# Patient Record
Sex: Male | Born: 1970
Health system: Southern US, Community
[De-identification: ages and names within clinical notes are randomized; demographics above are authoritative.]

## PROBLEM LIST (undated history)

## (undated) DIAGNOSIS — S060X9A Concussion with loss of consciousness of unspecified duration, initial encounter: Secondary | ICD-10-CM

## (undated) DIAGNOSIS — S060XAA Concussion with loss of consciousness status unknown, initial encounter: Secondary | ICD-10-CM

## (undated) HISTORY — PX: WISDOM TOOTH EXTRACTION: SHX21

## (undated) HISTORY — PX: ADENOIDECTOMY: SUR15

## (undated) HISTORY — DX: Concussion with loss of consciousness of unspecified duration, initial encounter: S06.0X9A

## (undated) HISTORY — DX: Concussion with loss of consciousness status unknown, initial encounter: S06.0XAA

---

## 2015-01-09 ENCOUNTER — Other Ambulatory Visit (HOSPITAL_COMMUNITY): Payer: Self-pay | Admitting: Pulmonary Disease

## 2015-01-09 DIAGNOSIS — J690 Pneumonitis due to inhalation of food and vomit: Secondary | ICD-10-CM

## 2015-01-14 ENCOUNTER — Ambulatory Visit (HOSPITAL_COMMUNITY)
Admission: RE | Admit: 2015-01-14 | Discharge: 2015-01-14 | Disposition: A | Discharge status: Home / Self Care | Payer: 59 | Source: Ambulatory Visit | Attending: Pulmonary Disease | Admitting: Pulmonary Disease

## 2015-01-14 DIAGNOSIS — J690 Pneumonitis due to inhalation of food and vomit: Secondary | ICD-10-CM | POA: Diagnosis not present

## 2015-01-14 MED ORDER — IOHEXOL 300 MG/ML  SOLN
75.0000 mL | Freq: Once | INTRAMUSCULAR | Status: AC | PRN
  Administered 2015-01-14: 75 mL via INTRAVENOUS

## 2016-12-02 DIAGNOSIS — J069 Acute upper respiratory infection, unspecified: Secondary | ICD-10-CM | POA: Diagnosis not present

## 2016-12-02 DIAGNOSIS — J329 Chronic sinusitis, unspecified: Secondary | ICD-10-CM | POA: Diagnosis not present

## 2019-02-09 ENCOUNTER — Other Ambulatory Visit: Payer: Self-pay

## 2019-02-09 DIAGNOSIS — Z20822 Contact with and (suspected) exposure to covid-19: Secondary | ICD-10-CM

## 2019-02-10 LAB — NOVEL CORONAVIRUS, NAA: SARS-CoV-2, NAA: NOT DETECTED

## 2019-06-28 ENCOUNTER — Ambulatory Visit: Payer: 59 | Attending: Internal Medicine

## 2019-06-28 ENCOUNTER — Other Ambulatory Visit: Payer: Self-pay

## 2019-06-28 DIAGNOSIS — Z20822 Contact with and (suspected) exposure to covid-19: Secondary | ICD-10-CM

## 2019-06-29 LAB — SARS-COV-2, NAA 2 DAY TAT

## 2019-06-29 LAB — NOVEL CORONAVIRUS, NAA: SARS-CoV-2, NAA: NOT DETECTED

## 2019-08-08 ENCOUNTER — Other Ambulatory Visit: Payer: Self-pay

## 2019-08-08 ENCOUNTER — Emergency Department (HOSPITAL_COMMUNITY): Admission: EM | Admit: 2019-08-08 | Discharge: 2019-08-08 | Discharge status: Against Medical Advice | Payer: 59

## 2019-08-08 NOTE — ED Notes (Signed)
Patient called with no answer. °

## 2019-08-09 ENCOUNTER — Ambulatory Visit: Admission: EM | Admit: 2019-08-09 | Discharge: 2019-08-09 | Payer: 59 | Source: Home / Self Care

## 2019-08-09 ENCOUNTER — Emergency Department (HOSPITAL_COMMUNITY)
Admission: EM | Admit: 2019-08-09 | Discharge: 2019-08-09 | Disposition: A | Discharge status: Home / Self Care | Payer: 59 | Attending: Emergency Medicine | Admitting: Emergency Medicine

## 2019-08-09 ENCOUNTER — Emergency Department (HOSPITAL_COMMUNITY): Payer: 59

## 2019-08-09 ENCOUNTER — Other Ambulatory Visit: Payer: Self-pay

## 2019-08-09 ENCOUNTER — Encounter (HOSPITAL_COMMUNITY): Payer: Self-pay

## 2019-08-09 DIAGNOSIS — S00531A Contusion of lip, initial encounter: Secondary | ICD-10-CM | POA: Diagnosis not present

## 2019-08-09 DIAGNOSIS — Y999 Unspecified external cause status: Secondary | ICD-10-CM | POA: Insufficient documentation

## 2019-08-09 DIAGNOSIS — W228XXA Striking against or struck by other objects, initial encounter: Secondary | ICD-10-CM | POA: Diagnosis not present

## 2019-08-09 DIAGNOSIS — Y9289 Other specified places as the place of occurrence of the external cause: Secondary | ICD-10-CM | POA: Diagnosis not present

## 2019-08-09 DIAGNOSIS — Z880 Allergy status to penicillin: Secondary | ICD-10-CM | POA: Diagnosis not present

## 2019-08-09 DIAGNOSIS — S060X9A Concussion with loss of consciousness of unspecified duration, initial encounter: Secondary | ICD-10-CM | POA: Insufficient documentation

## 2019-08-09 DIAGNOSIS — S0990XA Unspecified injury of head, initial encounter: Secondary | ICD-10-CM | POA: Diagnosis present

## 2019-08-09 DIAGNOSIS — Y9363 Activity, rugby: Secondary | ICD-10-CM | POA: Diagnosis not present

## 2019-08-09 DIAGNOSIS — S0083XA Contusion of other part of head, initial encounter: Secondary | ICD-10-CM | POA: Diagnosis not present

## 2019-08-09 DIAGNOSIS — S0081XA Abrasion of other part of head, initial encounter: Secondary | ICD-10-CM

## 2019-08-09 MED ORDER — ONDANSETRON 4 MG PO TBDP: 4.0000 mg | ORAL_TABLET | Freq: Three times a day (TID) | ORAL | 0 refills | Status: DC | PRN

## 2019-08-09 MED ORDER — MELOXICAM 7.5 MG PO TABS: 7.5000 mg | ORAL_TABLET | Freq: Two times a day (BID) | ORAL | 0 refills | Status: AC | PRN

## 2019-08-09 NOTE — ED Triage Notes (Signed)
Pt reports was using some post hole diggers Monday and they broke and hit him in left side of face.  Reports fell and blacked out.  Pt says since then has had nausea, vomiting, and feeling like he can't think clearly.  Reports history of concussion and says feels very similar.

## 2019-08-09 NOTE — ED Provider Notes (Signed)
[facility] EMERGENCY DEPARTMENT Provider Note   CSN: [id number] Arrival date & time: 08/09/19  5093     History Chief Complaint  Patient presents with  . Head Injury    Travis Farmer is a 49 y.o. male.  HPI   This patient is a 49 year old male, has a history of prior minor head injury when he was younger playing rugby, denies any other significant chronic medical conditions.  Reportedly 2 days ago while the patient was putting in a sprinkler system and using a post hole digger he was accidentally struck in the face on the left side of his cheek with a post hole digger which then caused him to potentially pass out in fact he has very little memory of 24 hours after that event.  His sister who is a secondary historian was called from the patient's best friend who asked her to come and check on him because he was not his normal self.  There was some significant memory loss and they were concerned that with his headache there may be more going on.  She reports that when she came to her parents house last night to check on him he was sleeping, when she woke up this morning he seemed to be awake alert and able to follow commands with normal speech and coordination.  There was definitely a dense memory loss with evidence of left-sided facial injury but no signs of focal neurologic deficits.  He does have a headache, it is poorly described.  He is able to recall other events outside of the 24 hours following the injury, and answer questions appropriately  History reviewed. No pertinent past medical history.  There are no problems to display for this patient.   History reviewed. No pertinent surgical history.     No family history on file.  Social History   Tobacco Use  . Smoking status: Never Smoker  . Smokeless tobacco: Never Used  Substance Use Topics  . Alcohol use: Never  . Drug use: Never    Home Medications Prior to Admission medications   Medication Sig Start Date End  Date Taking? Authorizing Provider  meloxicam (MOBIC) 7.5 MG tablet Take 1 tablet (7.5 mg total) by mouth 2 (two) times daily as needed for up to 14 days for pain. 08/09/19 08/23/19  Noemi Chapel, MD  ondansetron (ZOFRAN ODT) 4 MG disintegrating tablet Take 1 tablet (4 mg total) by mouth every 8 (eight) hours as needed for nausea. 08/09/19   Noemi Chapel, MD    Allergies    Penicillins  Review of Systems   Review of Systems  All other systems reviewed and are negative.   Physical Exam Updated Vital Signs BP (!) 142/92 (BP Location: Right Arm)   Pulse (!) 106   Temp 97.6 F (36.4 C) (Oral)   Resp 16   Ht 1.803 m (5\' 11" )   Wt 77.1 kg   SpO2 96%   BMI 23.71 kg/m   Physical Exam Vitals and nursing note reviewed.  Constitutional:      General: He is not in acute distress.    Appearance: He is well-developed.  HENT:     Head: Normocephalic.     Comments: Slight abrasion and bruising to the left side of the cheek and upper lip, dentition is normal    Nose:     Comments: No tenderness over the nasal bridge, no nasal septal hematoma or epistaxis    Mouth/Throat:     Mouth: Mucous membranes are  dry.     Pharynx: No oropharyngeal exudate.  Eyes:     General: No scleral icterus.       Right eye: No discharge.        Left eye: No discharge.     Conjunctiva/sclera: Conjunctivae normal.     Pupils: Pupils are equal, round, and reactive to light.  Neck:     Thyroid: No thyromegaly.     Vascular: No JVD.  Cardiovascular:     Rate and Rhythm: Normal rate and regular rhythm.     Heart sounds: Normal heart sounds. No murmur. No friction rub. No gallop.   Pulmonary:     Effort: Pulmonary effort is normal. No respiratory distress.     Breath sounds: Normal breath sounds. No wheezing or rales.  Abdominal:     General: Bowel sounds are normal. There is no distension.     Palpations: Abdomen is soft. There is no mass.     Tenderness: There is no abdominal tenderness.  Musculoskeletal:         General: No tenderness. Normal range of motion.     Cervical back: Normal range of motion and neck supple.  Lymphadenopathy:     Cervical: No cervical adenopathy.  Skin:    General: Skin is warm and dry.     Findings: No erythema or rash.  Neurological:     Mental Status: He is alert.     Coordination: Coordination normal.     Comments: Speech is clear, cranial nerves III through XII are intact, memory is intact, strength is normal in all 4 extremities including grips, sensation is intact to light touch and pinprick in all 4 extremities. Coordination as tested by finger-nose-finger is normal, no limb ataxia. Normal gait, normal reflexes at the patellar tendons bilaterally  Psychiatric:        Behavior: Behavior normal.     ED Results / Procedures / Treatments   Labs (all labs ordered are listed, but only abnormal results are displayed) Labs Reviewed - No data to display  EKG None  Radiology No results found.  Procedures Procedures (including critical care time)  Medications Ordered in ED Medications - No data to display  ED Course  I have reviewed the triage vital signs and the nursing notes.  Pertinent labs & imaging results that were available during my care of the patient were reviewed by me and considered in my medical decision making (see chart for details).    MDM Rules/Calculators/A&P                      This patient is well-appearing at this time however he has had significant memory loss ongoing headache and evidence of head injury after having concussions when he was younger.  We will perform CT scan of the brain to rule out intracranial hemorrhage or other injury.  At this time the patient will need to be evaluated with a CT scan, the patient and the sister are at the bedside and expressed their understanding and agreement to the plan.  CT scan negative, patient updated, stable for discharge, anti-inflammatories at home  Final Clinical Impression(s) /  ED Diagnoses Final diagnoses:  Concussion with loss of consciousness, initial encounter  Abrasion, face w/o infection    Rx / DC Orders ED Discharge Orders         Ordered    meloxicam (MOBIC) 7.5 MG tablet  2 times daily PRN     08/09/19 0919  ondansetron (ZOFRAN ODT) 4 MG disintegrating tablet  Every 8 hours PRN     08/09/19 0920           Noemi Chapel, MD 08/09/19 [phone]

## 2019-08-09 NOTE — ED Notes (Signed)
Patient is being discharged from the Urgent Care and sent to the Emergency Department via family POV . Per NP, patient is in need of higher level of care due to head injury. Patient is aware and verbalizes understanding of plan of care.

## 2019-08-09 NOTE — Discharge Instructions (Signed)
Your CT scan is normal Read attached instructions  Mobic for pain Zofran for nausea  ER for worsening symptoms  See your doctor in the next week for a recheck

## 2019-10-03 ENCOUNTER — Encounter (INDEPENDENT_AMBULATORY_CARE_PROVIDER_SITE_OTHER): Payer: Self-pay | Admitting: *Deleted

## 2020-02-15 ENCOUNTER — Ambulatory Visit: Payer: 59 | Attending: Internal Medicine

## 2020-02-15 DIAGNOSIS — Z23 Encounter for immunization: Secondary | ICD-10-CM

## 2020-02-15 NOTE — Progress Notes (Signed)
   Covid-19 Vaccination Clinic  Name:  Travis Farmer    MRN: [id number] DOB: [birth year]  02/15/2020  [name] was observed post Covid-19 immunization for 15 minutes without incident. He was provided with Vaccine Information Sheet and instruction to access the V-Safe system.   [name] was instructed to call 911 with any severe reactions post vaccine: Marland Kitchen Difficulty breathing  . Swelling of face and throat  . A fast heartbeat  . A bad rash all over body  . Dizziness and weakness   Immunizations Administered    Name Date Dose VIS Date Route   Pfizer COVID-19 Vaccine 02/15/2020  2:27 PM 0.3 mL 12/20/2019 Intramuscular   Manufacturer: [employer]   Lot: [id number]   NDC: [address]-2

## 2020-03-11 ENCOUNTER — Other Ambulatory Visit: Payer: Self-pay

## 2020-03-11 ENCOUNTER — Encounter: Payer: Self-pay | Admitting: Neurology

## 2020-03-11 ENCOUNTER — Ambulatory Visit: Payer: 59 | Admitting: Neurology

## 2020-03-11 VITALS — BP 122/82 | HR 79 | Ht 70.0 in | Wt 165.2 lb

## 2020-03-11 DIAGNOSIS — F0781 Postconcussional syndrome: Secondary | ICD-10-CM

## 2020-03-11 DIAGNOSIS — R4020 Unspecified coma: Secondary | ICD-10-CM | POA: Diagnosis not present

## 2020-03-11 MED ORDER — DIVALPROEX SODIUM ER 500 MG PO TB24: 500.0000 mg | ORAL_TABLET | Freq: Every day | ORAL | 2 refills | Status: DC

## 2020-03-11 NOTE — Progress Notes (Signed)
[facility] [address] [place]. [place] [address] [phone]       OFFICE CONSULT NOTE  [name] Date of Birth:  03/27/1970 Medical Record Number:  [id number]   Referring MD: Delphina Cahill  Reason for Referral: Postconcussion symptoms  HPI: Travis Farmer is a pleasant 50 year old Caucasian male seen today for initial office consultation visit.  History is obtained from the patient and his wife as well as review of referral notes.  No relevant imaging studies are available for review.  Patient states that he has had some multifocal symptoms going of episode of passing out and facial trauma on 08/09/2019.  He states he was alone at home and was probably working out in the garden.  He woke up at home disoriented and confused at trouble texting his family members called him on the phone he was not acting right.  He had sustained bruising on his face.  There is no tongue bite or other injury noted.  Patient denies any headache or soreness at that time.  He was seen at [facility] emergency room on 08/09/2019 where he actually stated to Dr. Noemi Chapel, ER, MD that he was putting a sprinkler system in his garden and using a post whole digger when he accidentally stuck it on his face on the left side of his cheek which potentially caused him to pass out.  He had very little memory for 24 hours after that event and was at home alone.  Patient did describe a headache after that.  CT scan of the head on 08/09/2019 personally reviewed by me was normal.  Patient states since then he has had trouble sleeping trouble focusing and difficulty working at his job.  He is feels he cannot concentrate for long and has easy distractibility and trouble with multitasking.  He also gets headaches off and on as well as neck pain.  He feels dizzy at times even emotionally labile and starts crying.  He denies feeling depressed.  He does have history of several minor head injuries when he played  rugby in college in high school.  He also had 1 concussion which he got from [place].  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  He has no prior history of seizures, stroke, TIA, migraines or significant neurological problems.  ROS:   14 system review of systems is positive for headache, memory loss, decreased sleeping, weakness, decreased concentration all other symptoms negative  PMH:  Past Medical History:  Diagnosis Date  . Concussion     Social History:  Social History   Socioeconomic History  . Marital status: Married    Spouse name: Travis Farmer  . Number of children: Not on file  . Years of education: Not on file  . Highest education level: Not on file  Occupational History  . Occupation: full time  Tobacco Use  . Smoking status: Never Smoker  . Smokeless tobacco: Never Used  Substance and Sexual Activity  . Alcohol use: Never  . Drug use: Never  . Sexual activity: Not on file  Other Topics Concern  . Not on file  Social History Narrative   Lives with wife and 2 daughter   Right Handed   Drinks 2-3 cups caffeine daily   Social Determinants of Health   Financial Resource Strain: Not on file  Food Insecurity: Not on file  Transportation Needs: Not on file  Physical Activity: Not on file  Stress: Not on file  Social Connections: Not on file  Intimate Partner Violence: Not on file    Medications:   Current Outpatient Medications on File Prior to Visit  Medication Sig Dispense Refill  . ondansetron (ZOFRAN ODT) 4 MG disintegrating tablet Take 1 tablet (4 mg total) by mouth every 8 (eight) hours as needed for nausea. 10 tablet 0   No current facility-administered medications on file prior to visit.    Allergies:   Allergies  Allergen Reactions  . Penicillins     Physical Exam General: well developed, well nourished middle-aged Caucasian male, seated, in no evident distress Head: head  normocephalic and atraumatic.   Neck: supple with no carotid or supraclavicular bruits Cardiovascular: regular rate and rhythm, no murmurs Musculoskeletal: no deformity Skin:  no rash/petichiae Vascular:  Normal pulses all extremities  Neurologic Exam Mental Status: Awake and fully alert. Oriented to place and time. Recent and remote memory intact. Attention span, concentration and fund of knowledge appropriate. Mood and affect appropriate.  Mini-Mental status exam score 28/30 with 1 deficit in recall and attention calculation.  Clock drawing 4/4.  Able to name 14 animals which can walk on forelegs.  Geriatric depression scale score 2 not suggestive of depression Cranial Nerves: Fundoscopic exam reveals sharp disc margins. Pupils equal, briskly reactive to light. Extraocular movements full without nystagmus. Visual fields full to confrontation. Hearing intact. Facial sensation intact. Face, tongue, palate moves normally and symmetrically.  Motor: Normal bulk and tone. Normal strength in all tested extremity muscles. Sensory.: intact to touch , pinprick , position and vibratory sensation.  Coordination: Rapid alternating movements normal in all extremities. Finger-to-nose and heel-to-shin performed accurately bilaterally. Gait and Station: Arises from chair without difficulty. Stance is normal. Gait demonstrates normal stride length and balance . Able to heel, toe and tandem walk without difficulty.  Reflexes: 1+ and symmetric. Toes downgoing.       ASSESSMENT: 50 year old patient with episode of brief loss of consciousness with facial trauma having symptoms consistent with postconcussion syndrome following episode in June 2021.  He also has underlying stress and anxiety which could be contributing.     PLAN: I had a long discussion with the patient and his wife regarding his episode of brief loss of consciousness and followed by memory loss and multifocal symptoms being of unclear etiology  possibly unwitnessed seizure versus postconcussion symptoms.  Recommend further evaluation with checking MRI scan of the brain and EEG.  Trial of Depakote ER 500 mg daily for his headaches and multifocal symptoms.  I also encouraged him to increase participation in regular activities for stress relaxation like meditation yoga and exercises.  He will return for follow-up in the future in 2 months or call earlier if necessary.  Greater than 50% time during this 45-minute consultation visit was spent on counseling and coordination of care about his multifocal symptoms and answering questions. Antony Contras, MD Note: This document was prepared with digital dictation and possible smart phrase technology. Any transcriptional errors that result from this process are unintentional.

## 2020-03-11 NOTE — Patient Instructions (Signed)
I had a long discussion with the patient and his wife regarding his episode of brief loss of consciousness and followed by memory loss and multifocal symptoms being of unclear etiology possibly unwitnessed seizure versus postconcussion symptoms.  Recommend further evaluation with checking MRI scan of the brain and EEG.  Trial of Depakote ER 500 mg daily for his headaches and multifocal symptoms.  I also encouraged him to increase participation in regular activities for stress relaxation like meditation yoga and exercises.  He will return for follow-up in the future in 2 months or call earlier if necessary.  Mindfulness-Based Stress Reduction Mindfulness-based stress reduction (MBSR) is a program that helps people learn to practice mindfulness. Mindfulness is the practice of intentionally paying attention to the present moment. MBSR focuses on developing self-awareness, which allows you to respond to life stress without judgment or negative emotions. It can be learned and practiced through techniques such as education, breathing exercises, meditation, and yoga. MBSR includes several mindfulness techniques in one program. MBSR works best when you understand the treatment, are willing to try new things, and can commit to spending time practicing what you learn. MBSR training may include learning about:  How your emotions, thoughts, and reactions affect your body.  New ways to respond to things that cause negative thoughts to start (triggers).  How to notice your thoughts and let go of them.  Practicing awareness of everyday things that you normally do without thinking.  The techniques and goals of different types of meditation. What are the benefits of MBSR? MBSR can have many benefits, which include helping you to:  Develop self-awareness. This refers to knowing and understanding yourself.  Learn skills and attitudes that help you to participate in your own health care.  Learn new ways to care  for yourself.  Be more accepting about how things are, and let things go.  Be less judgmental and approach things with an open mind.  Be patient with yourself and trust yourself more. MBSR has also been shown to:  Reduce negative emotions, such as depression and anxiety.  Improve memory and focus.  Change how you sense and approach pain.  Boost your body's ability to fight infections.  Help you connect better with other people.  Improve your sense of well-being. Follow these instructions at home:  Find a local in-person or online MBSR program.  Set aside some time regularly for mindfulness practice.  Find a mindfulness practice that works best for you. This may include one or more of the following: ? Meditation. Meditation involves focusing your mind on a certain thought or activity. ? Breathing awareness exercises. These help you to stay present by focusing on your breath. ? Body scan. For this practice, you lie down and pay attention to each part of your body from head to toe. You can identify tension and soreness and intentionally relax parts of your body. ? Yoga. Yoga involves stretching and breathing, and it can improve your ability to move and be flexible. It can also provide an experience of testing your body's limits, which can help you release stress. ? Mindful eating. This way of eating involves focusing on the taste, texture, color, and smell of each bite of food. Because this slows down eating and helps you feel full sooner, it can be an important part of a weight-loss plan.  Find a podcast or recording that provides guidance for breathing awareness, body scan, or meditation exercises. You can listen to these any time when you have  a free moment to rest without distractions.  Follow your treatment plan as told by your health care provider. This may include taking regular medicines and making changes to your diet or lifestyle as recommended.   How to practice  mindfulness To do a basic awareness exercise:  Find a comfortable place to sit.  Pay attention to the present moment. Observe your thoughts, feelings, and surroundings just as they are.  Avoid placing judgment on yourself, your feelings, or your surroundings. Make note of any judgment that comes up, and let it go.  Your mind may wander, and that is okay. Make note of when your thoughts drift, and return your attention to the present moment. To do basic mindfulness meditation:  Find a comfortable place to sit. This may include a stable chair or a firm floor cushion. ? Sit upright with your back straight. Let your arms fall next to your side with your hands resting on your legs. ? If sitting in a chair, rest your feet flat on the floor. ? If sitting on a cushion, cross your legs in front of you.  Keep your head in a neutral position with your chin dropped slightly. Relax your jaw and rest the tip of your tongue on the roof of your mouth. Drop your gaze to the floor. You can close your eyes if you like.  Breathe normally and pay attention to your breath. Feel the air moving in and out of your nose. Feel your belly expanding and relaxing with each breath.  Your mind may wander, and that is okay. Make note of when your thoughts drift, and return your attention to your breath.  Avoid placing judgment on yourself, your feelings, or your surroundings. Make note of any judgment or feelings that come up, let them go, and bring your attention back to your breath.  When you are ready, lift your gaze or open your eyes. Pay attention to how your body feels after the meditation. Where to find more information You can find more information about MBSR from:  Your health care provider.  Community-based meditation centers or programs.  Programs offered near you. Summary  Mindfulness-based stress reduction (MBSR) is a program that teaches you how to intentionally pay attention to the present moment.  It is used with other treatments to help you cope better with daily stress, emotions, and pain.  MBSR focuses on developing self-awareness, which allows you to respond to life stress without judgment or negative emotions.  MBSR programs may involve learning different mindfulness practices, such as breathing exercises, meditation, yoga, body scan, or mindful eating. Find a mindfulness practice that works best for you, and set aside time for it on a regular basis. This information is not intended to replace advice given to you by your health care provider. Make sure you discuss any questions you have with your health care provider. Document Revised: 11/03/2019 Document Reviewed: 11/03/2019 Elsevier Patient Education  2021 [employer].

## 2020-03-13 ENCOUNTER — Telehealth: Payer: Self-pay | Admitting: Neurology

## 2020-03-13 NOTE — Telephone Encounter (Signed)
[employer] pending uplaoded notes on the portal  

## 2020-03-18 ENCOUNTER — Other Ambulatory Visit: Payer: 59

## 2020-03-20 NOTE — Telephone Encounter (Signed)
Russell:  [id number] (exp. 03/14/20 to 04/28/20) patient is scheduled at [facility] for 03/27/20.

## 2020-03-26 ENCOUNTER — Ambulatory Visit: Payer: 59 | Admitting: Neurology

## 2020-03-26 DIAGNOSIS — F0781 Postconcussional syndrome: Secondary | ICD-10-CM

## 2020-03-26 DIAGNOSIS — R4020 Unspecified coma: Secondary | ICD-10-CM | POA: Diagnosis not present

## 2020-03-27 ENCOUNTER — Ambulatory Visit: Payer: 59

## 2020-03-27 ENCOUNTER — Other Ambulatory Visit: Payer: Self-pay

## 2020-03-27 ENCOUNTER — Telehealth: Payer: Self-pay | Admitting: *Deleted

## 2020-03-27 DIAGNOSIS — R4020 Unspecified coma: Secondary | ICD-10-CM | POA: Diagnosis not present

## 2020-03-27 MED ORDER — GADOBENATE DIMEGLUMINE 529 MG/ML IV SOLN
15.0000 mL | Freq: Once | INTRAVENOUS | Status: AC | PRN
  Administered 2020-03-27: 15 mL via INTRAVENOUS

## 2020-03-27 MED ORDER — GADOBENATE DIMEGLUMINE 529 MG/ML IV SOLN: 15.0000 mL | Freq: Once | INTRAVENOUS | Status: DC | PRN

## 2020-03-27 NOTE — Telephone Encounter (Signed)
I spoke to the patient and informed him of the results.

## 2020-03-27 NOTE — Progress Notes (Signed)
Kindly inform the patient that EEG was normal

## 2020-03-27 NOTE — Telephone Encounter (Signed)
-----   Message from Garvin Fila, MD sent at 03/27/2020  9:26 AM EST ----- Kindly inform the patient that EEG was normal

## 2020-04-03 NOTE — Progress Notes (Signed)
Kindly inform the patient her MRI scan of the brain was normal and did not show any worrisome findings

## 2020-04-04 ENCOUNTER — Telehealth: Payer: Self-pay | Admitting: *Deleted

## 2020-04-04 NOTE — Telephone Encounter (Signed)
LVM for pt about MRI results per Dr. Leonie Man note. Advised he didn't have to return call unless he had further questions.

## 2020-04-04 NOTE — Telephone Encounter (Signed)
-----   Message from Garvin Fila, MD sent at 04/03/2020  3:49 PM EST ----- Kindly inform the patient her MRI scan of the brain was normal and did not show any worrisome findings

## 2020-04-12 ENCOUNTER — Other Ambulatory Visit (INDEPENDENT_AMBULATORY_CARE_PROVIDER_SITE_OTHER): Payer: Self-pay

## 2020-04-12 ENCOUNTER — Telehealth (INDEPENDENT_AMBULATORY_CARE_PROVIDER_SITE_OTHER): Payer: Self-pay

## 2020-04-12 ENCOUNTER — Encounter (INDEPENDENT_AMBULATORY_CARE_PROVIDER_SITE_OTHER): Payer: Self-pay | Admitting: *Deleted

## 2020-04-12 ENCOUNTER — Encounter (INDEPENDENT_AMBULATORY_CARE_PROVIDER_SITE_OTHER): Payer: Self-pay

## 2020-04-12 DIAGNOSIS — Z1211 Encounter for screening for malignant neoplasm of colon: Secondary | ICD-10-CM

## 2020-04-12 MED ORDER — PEG 3350-KCL-NA BICARB-NACL 420 G PO SOLR: 4000.0000 mL | ORAL | 0 refills | Status: DC

## 2020-04-12 NOTE — Telephone Encounter (Signed)
Referring MD/PCP: [place] Crane   Procedure: Tcs  Reason/Indication:  Screening  Has patient had this procedure before?  no  If so, when, by whom and where?    Is there a family history of colon cancer?  no  Who?  What age when diagnosed?    Is patient diabetic?   no      Does patient have prosthetic heart valve or mechanical valve?  no  Do you have a pacemaker/defibrillator?  no  Has patient ever had endocarditis/atrial fibrillation? no  Have you had a stroke/heart attack last 6 mths? no  Does patient use oxygen? no  Has patient had joint replacement within last 12 months?  no  Is patient constipated or do they take laxatives? no  Does patient have a history of alcohol/drug use?  no  Is patient on blood thinner such as Coumadin, Plavix and/or Aspirin? no  Medications: none  Allergies: pcn  Procedure date & time: 05/01/20

## 2020-04-12 NOTE — Telephone Encounter (Signed)
Travis Farmer, CMA  

## 2020-04-12 NOTE — Telephone Encounter (Signed)
Noted  

## 2020-04-25 ENCOUNTER — Encounter (HOSPITAL_COMMUNITY): Payer: Self-pay | Admitting: Gastroenterology

## 2020-04-26 ENCOUNTER — Other Ambulatory Visit (INDEPENDENT_AMBULATORY_CARE_PROVIDER_SITE_OTHER): Payer: Self-pay

## 2020-04-29 ENCOUNTER — Other Ambulatory Visit (HOSPITAL_COMMUNITY): Payer: 59

## 2020-04-29 ENCOUNTER — Other Ambulatory Visit (HOSPITAL_COMMUNITY)
Admission: RE | Admit: 2020-04-29 | Discharge: 2020-04-29 | Disposition: A | Discharge status: Home / Self Care | Payer: 59 | Source: Ambulatory Visit | Attending: Gastroenterology | Admitting: Gastroenterology

## 2020-04-29 ENCOUNTER — Other Ambulatory Visit: Payer: Self-pay

## 2020-04-29 DIAGNOSIS — Z01812 Encounter for preprocedural laboratory examination: Secondary | ICD-10-CM | POA: Insufficient documentation

## 2020-04-29 DIAGNOSIS — Z20822 Contact with and (suspected) exposure to covid-19: Secondary | ICD-10-CM | POA: Insufficient documentation

## 2020-04-29 LAB — SARS CORONAVIRUS 2 (TAT 6-24 HRS): SARS Coronavirus 2: NEGATIVE

## 2020-05-01 ENCOUNTER — Other Ambulatory Visit: Payer: Self-pay

## 2020-05-01 ENCOUNTER — Ambulatory Visit (HOSPITAL_COMMUNITY)
Admission: RE | Admit: 2020-05-01 | Discharge: 2020-05-01 | Disposition: A | Discharge status: Home / Self Care | Payer: 59 | Attending: Gastroenterology | Admitting: Gastroenterology

## 2020-05-01 ENCOUNTER — Ambulatory Visit (HOSPITAL_COMMUNITY): Admission: RE | Admit: 2020-05-01 | Payer: 59 | Source: Home / Self Care | Admitting: Gastroenterology

## 2020-05-01 ENCOUNTER — Ambulatory Visit (HOSPITAL_COMMUNITY): Payer: 59 | Admitting: Anesthesiology

## 2020-05-01 ENCOUNTER — Encounter (HOSPITAL_COMMUNITY): Admission: RE | Payer: Self-pay | Source: Home / Self Care

## 2020-05-01 ENCOUNTER — Encounter (HOSPITAL_COMMUNITY)
Admission: RE | Disposition: A | Discharge status: Home / Self Care | Payer: Self-pay | Source: Home / Self Care | Attending: Gastroenterology

## 2020-05-01 ENCOUNTER — Encounter (HOSPITAL_COMMUNITY): Payer: Self-pay | Admitting: Gastroenterology

## 2020-05-01 DIAGNOSIS — C7A8 Other malignant neuroendocrine tumors: Secondary | ICD-10-CM | POA: Diagnosis not present

## 2020-05-01 DIAGNOSIS — Z800 Family history of malignant neoplasm of digestive organs: Secondary | ICD-10-CM | POA: Insufficient documentation

## 2020-05-01 DIAGNOSIS — Z79899 Other long term (current) drug therapy: Secondary | ICD-10-CM | POA: Diagnosis not present

## 2020-05-01 DIAGNOSIS — D128 Benign neoplasm of rectum: Secondary | ICD-10-CM | POA: Diagnosis not present

## 2020-05-01 DIAGNOSIS — Z1211 Encounter for screening for malignant neoplasm of colon: Secondary | ICD-10-CM | POA: Insufficient documentation

## 2020-05-01 DIAGNOSIS — K621 Rectal polyp: Secondary | ICD-10-CM | POA: Diagnosis not present

## 2020-05-01 DIAGNOSIS — K648 Other hemorrhoids: Secondary | ICD-10-CM

## 2020-05-01 HISTORY — PX: POLYPECTOMY: SHX149

## 2020-05-01 HISTORY — PX: COLONOSCOPY WITH PROPOFOL: SHX5780

## 2020-05-01 LAB — HM COLONOSCOPY

## 2020-05-01 SURGERY — COLONOSCOPY WITH PROPOFOL
Anesthesia: Monitor Anesthesia Care

## 2020-05-01 SURGERY — COLONOSCOPY WITH PROPOFOL
Anesthesia: General

## 2020-05-01 MED ORDER — LIDOCAINE HCL (CARDIAC) PF 50 MG/5ML IV SOSY
PREFILLED_SYRINGE | INTRAVENOUS | Status: DC | PRN
  Administered 2020-05-01: 50 mg via INTRAVENOUS

## 2020-05-01 MED ORDER — STERILE WATER FOR IRRIGATION IR SOLN
Status: DC | PRN
  Administered 2020-05-01: 200 mL

## 2020-05-01 MED ORDER — LACTATED RINGERS IV SOLN: INTRAVENOUS | Status: DC

## 2020-05-01 MED ORDER — PROPOFOL 10 MG/ML IV BOLUS
INTRAVENOUS | Status: AC
  Filled 2020-05-01: qty 40

## 2020-05-01 MED ORDER — PROPOFOL 500 MG/50ML IV EMUL
INTRAVENOUS | Status: DC | PRN
  Administered 2020-05-01: 150 ug/kg/min via INTRAVENOUS

## 2020-05-01 MED ORDER — PROPOFOL 10 MG/ML IV BOLUS
INTRAVENOUS | Status: DC | PRN
  Administered 2020-05-01: 100 mg via INTRAVENOUS
  Administered 2020-05-01: 50 mg via INTRAVENOUS

## 2020-05-01 NOTE — Anesthesia Postprocedure Evaluation (Signed)
Anesthesia Post Note  Patient: Travis Farmer  Procedure(s) Performed: COLONOSCOPY WITH PROPOFOL (N/A ) POLYPECTOMY INTESTINAL  Patient location during evaluation: PACU Anesthesia Type: General Level of consciousness: awake, oriented, awake and alert and patient cooperative Pain management: satisfactory to patient Vital Signs Assessment: post-procedure vital signs reviewed and stable Respiratory status: spontaneous breathing, respiratory function stable and nonlabored ventilation Cardiovascular status: stable Postop Assessment: no apparent nausea or vomiting Anesthetic complications: no   No complications documented.   Last Vitals:  Vitals:   05/01/20 0744  BP: (!) 164/99  Pulse: 92  Resp: 18  Temp: 36.7 C  SpO2: 99%    Last Pain:  Vitals:   05/01/20 0744  TempSrc: Oral  PainSc: 4                  Maykel Reitter

## 2020-05-01 NOTE — Op Note (Signed)
[facility] Patient Name: Travis Farmer Procedure Date: 05/01/2020 8:07 AM MRN: [id number] Date of Birth: [birth year] Attending MD: Maylon Peppers ,  CSN: [id number] Age: 50 Admit Type: Outpatient Procedure:                Colonoscopy Indications:              Screening for colorectal malignant neoplasm Providers:                Maylon Peppers, [place] Page, [place] Risa Grill, Technician Referring MD:              Medicines:                Monitored Anesthesia Care Complications:            No immediate complications. Estimated Blood Loss:     Estimated blood loss: none. Procedure:                Pre-Anesthesia Assessment:                           - Prior to the procedure, a History and Physical                            was performed, and patient medications, allergies                            and sensitivities were reviewed. The patient's                            tolerance of previous anesthesia was reviewed.                           - The risks and benefits of the procedure and the                            sedation options and risks were discussed with the                            patient. All questions were answered and informed                            consent was obtained.                           - ASA Grade Assessment: I - A normal, healthy                            patient.                           After obtaining informed consent, the colonoscope                            was passed under direct vision. Throughout the  procedure, the patient's blood pressure, pulse, and                            oxygen saturations were monitored continuously. The                            [id number]-[id number] ([id number]) was introduced through the                            anus and advanced to the the cecum, identified by                            appendiceal orifice and ileocecal valve. The                             colonoscopy was performed without difficulty. The                            patient tolerated the procedure well. Scope                            withdrawal time was 12 minutes. The quality of the                            bowel preparation was excellent. Scope In: 8:29:36 AM Scope Out: 8:54:03 AM Scope Withdrawal Time: 0 hours 20 minutes 29 seconds  Total Procedure Duration: 0 hours 24 minutes 27 seconds  Findings:      The perianal and digital rectal examinations were normal.      Two sessile polyps were found in the rectum. The polyps were 8 mm in       size. These polyps were removed with a cold snare. Resection and       retrieval were complete.      Non-bleeding internal hemorrhoids were found during retroflexion. The       hemorrhoids were small. Impression:               - Two 8 mm polyps in the rectum, removed with a                            cold snare. Resected and retrieved.                           - Non-bleeding internal hemorrhoids. Moderate Sedation:      Per Anesthesia Care Recommendation:           - Discharge patient to home (ambulatory).                           - Resume previous diet.                           - Await pathology results.                           - Repeat colonoscopy for surveillance based on  pathology results. Procedure Code(s):        --- Professional ---                           [phone], Colonoscopy, flexible; with removal of                            tumor(s), polyp(s), or other lesion(s) by snare                            technique Diagnosis Code(s):        --- Professional ---                           Z12.11, Encounter for screening for malignant                            neoplasm of colon                           K62.1, Rectal polyp                           K64.8, Other hemorrhoids CPT copyright 2019 American Medical Association. All rights reserved. The codes documented in this report are  preliminary and upon coder review may  be revised to meet current compliance requirements. Maylon Peppers, MD Maylon Peppers,  05/01/2020 8:57:32 AM This report has been signed electronically. Number of Addenda: 0

## 2020-05-01 NOTE — Addendum Note (Signed)
Addendum  created 05/01/20 0901 by Jonna Munro, CRNA   Charge Capture section accepted

## 2020-05-01 NOTE — Discharge Instructions (Signed)
You are being discharged to home.  Resume your previous diet.  We are waiting for your pathology results.  Your physician has recommended a repeat colonoscopy for surveillance based on pathology results.    Colonoscopy, Adult, Care After This sheet gives you information about how to care for yourself after your procedure. Your doctor may also give you more specific instructions. If you have problems or questions, call your doctor. What can I expect after the procedure? After the procedure, it is common to have:  A small amount of blood in your poop (stool) for 24 hours.  Some gas.  Mild cramping or bloating in your belly (abdomen). Follow these instructions at home: Eating and drinking  Drink enough fluid to keep your pee (urine) pale yellow.  Follow instructions from your doctor about what you cannot eat or drink.  Return to your normal diet as told by your doctor. Avoid heavy or fried foods that are hard to digest.   Activity  Rest as told by your doctor.  Do not sit for a long time without moving. Get up to take short walks every 1-2 hours. This is important. Ask for help if you feel weak or unsteady.  Return to your normal activities as told by your doctor. Ask your doctor what activities are safe for you. To help cramping and bloating:  Try walking around.  Put heat on your belly as told by your doctor. Use the heat source that your doctor recommends, such as a moist heat pack or a heating pad. ? Put a towel between your skin and the heat source. ? Leave the heat on for 20-30 minutes. ? Remove the heat if your skin turns bright red. This is very important if you are unable to feel pain, heat, or cold. You may have a greater risk of getting burned.   General instructions  If you were given a medicine to help you relax (sedative) during your procedure, it can affect you for many hours. Do not drive or use machinery until your doctor says that it is safe.  For the first  24 hours after the procedure: ? Do not sign important documents. ? Do not drink alcohol. ? Do your daily activities more slowly than normal. ? Eat foods that are soft and easy to digest.  Take over-the-counter or prescription medicines only as told by your doctor.  Keep all follow-up visits as told by your doctor. This is important. Contact a doctor if:  You have blood in your poop 2-3 days after the procedure. Get help right away if:  You have more than a small amount of blood in your poop.  You see large clumps of tissue (blood clots) in your poop.  Your belly is swollen.  You feel like you may vomit (nauseous).  You vomit.  You have a fever.  You have belly pain that gets worse, and medicine does not help your pain. Summary  After the procedure, it is common to have a small amount of blood in your poop. You may also have mild cramping and bloating in your belly.  If you were given a medicine to help you relax (sedative) during your procedure, it can affect you for many hours. Do not drive or use machinery until your doctor says that it is safe.  Get help right away if you have a lot of blood in your poop, feel like you may vomit, have a fever, or have more belly pain. This information is not  intended to replace advice given to you by your health care provider. Make sure you discuss any questions you have with your health care provider. Document Revised: 12/23/2018 Document Reviewed: 09/12/2018 Elsevier Patient Education  [place].   Colon Polyps  Colon polyps are tissue growths inside the colon, which is part of the large intestine. They are one of the types of polyps that can grow in the body. A polyp may be a round bump or a mushroom-shaped growth. You could have one polyp or more than one. Most colon polyps are noncancerous (benign). However, some colon polyps can become cancerous over time. Finding and removing the polyps early can help prevent this. What  are the causes? The exact cause of colon polyps is not known. What increases the risk? The following factors may make you more likely to develop this condition:  Having a family history of colorectal cancer or colon polyps.  Being older than 51 years of age.  Being younger than 51 years of age and having a significant family history of colorectal cancer or colon polyps or a genetic condition that puts you at higher risk of getting colon polyps.  Having inflammatory bowel disease, such as ulcerative colitis or Crohn's disease.  Having certain conditions passed from parent to child (hereditary conditions), such as: ? Familial adenomatous polyposis (FAP). ? Lynch syndrome. ? Turcot syndrome. ? Peutz-Jeghers syndrome. ? MUTYH-associated polyposis (MAP).  Being overweight.  Certain lifestyle factors. These include smoking cigarettes, drinking too much alcohol, not getting enough exercise, and eating a diet that is high in fat and red meat and low in fiber.  Having had childhood cancer that was treated with radiation of the abdomen. What are the signs or symptoms? Many times, there are no symptoms. If you have symptoms, they may include:  Blood coming from the rectum during a bowel movement.  Blood in the stool (feces). The blood may be bright red or very dark in color.  Pain in the abdomen.  A change in bowel habits, such as constipation or diarrhea. How is this diagnosed? This condition is diagnosed with a colonoscopy. This is a procedure in which a lighted, flexible scope is inserted into the opening between the buttocks (anus) and then passed into the colon to examine the area. Polyps are sometimes found when a colonoscopy is done as part of routine cancer screening tests. How is this treated? This condition is treated by removing any polyps that are found. Most polyps can be removed during a colonoscopy. Those polyps will then be tested for cancer. Additional treatment may be  needed depending on the results of testing. Follow these instructions at home: Eating and drinking  Eat foods that are high in fiber, such as fruits, vegetables, and whole grains.  Eat foods that are high in calcium and vitamin D, such as milk, cheese, yogurt, eggs, liver, fish, and broccoli.  Limit foods that are high in fat, such as fried foods and desserts.  Limit the amount of red meat, precooked or cured meat, or other processed meat that you eat, such as hot dogs, sausages, bacon, or meat loaves.  Limit sugary drinks.   Lifestyle  Maintain a healthy weight, or lose weight if recommended by your health care provider.  Exercise every day or as told by your health care provider.  Do not use any products that contain nicotine or tobacco, such as cigarettes, e-cigarettes, and chewing tobacco. If you need help quitting, ask your health care provider.  Do not drink alcohol if: ? Your health care provider tells you not to drink. ? You are pregnant, may be pregnant, or are planning to become pregnant.  If you drink alcohol: ? Limit how much you use to:  0-1 drink a day for women.  0-2 drinks a day for men. ? Know how much alcohol is in your drink. In the [place] one drink equals one 12 oz bottle of beer (355 mL), one 5 oz glass of wine (148 mL), or one 1 oz glass of hard liquor (44 mL). General instructions  Take over-the-counter and prescription medicines only as told by your health care provider.  Keep all follow-up visits. This is important. This includes having regularly scheduled colonoscopies. Talk to your health care provider about when you need a colonoscopy. Contact a health care provider if:  You have new or worsening bleeding during a bowel movement.  You have new or increased blood in your stool.  You have a change in bowel habits.  You lose weight for no known reason. Summary  Colon polyps are tissue growths inside the colon, which is part of the large  intestine. They are one type of polyp that can grow in the body.  Most colon polyps are noncancerous (benign), but some can become cancerous over time.  This condition is diagnosed with a colonoscopy.  This condition is treated by removing any polyps that are found. Most polyps can be removed during a colonoscopy. This information is not intended to replace advice given to you by your health care provider. Make sure you discuss any questions you have with your health care provider. Document Revised: 06/07/2019 Document Reviewed: 06/07/2019 Elsevier Patient Education  2021 [place].   Hemorrhoids Hemorrhoids are swollen veins in and around the rectum or anus. There are two types of hemorrhoids:  Internal hemorrhoids. These occur in the veins that are just inside the rectum. They may poke through to the outside and become irritated and painful.  External hemorrhoids. These occur in the veins that are outside the anus and can be felt as a painful swelling or hard lump near the anus. Most hemorrhoids do not cause serious problems, and they can be managed with home treatments such as diet and lifestyle changes. If home treatments do not help the symptoms, procedures can be done to shrink or remove the hemorrhoids. What are the causes? This condition is caused by increased pressure in the anal area. This pressure may result from various things, including:  Constipation.  Straining to have a bowel movement.  Diarrhea.  Pregnancy.  Obesity.  Sitting for long periods of time.  Heavy lifting or other activity that causes you to strain.  Anal sex.  Riding a bike for a long period of time. What are the signs or symptoms? Symptoms of this condition include:  Pain.  Anal itching or irritation.  Rectal bleeding.  Leakage of stool (feces).  Anal swelling.  One or more lumps around the anus. How is this diagnosed? This condition can often be diagnosed through a visual exam.  Other exams or tests may also be done, such as:  An exam that involves feeling the rectal area with a gloved hand (digital rectal exam).  An exam of the anal canal that is done using a small tube (anoscope).  A blood test, if you have lost a significant amount of blood.  A test to look inside the colon using a flexible tube with a camera on the end (sigmoidoscopy  or colonoscopy). How is this treated? This condition can usually be treated at home. However, various procedures may be done if dietary changes, lifestyle changes, and other home treatments do not help your symptoms. These procedures can help make the hemorrhoids smaller or remove them completely. Some of these procedures involve surgery, and others do not. Common procedures include:  Rubber band ligation. Rubber bands are placed at the base of the hemorrhoids to cut off their blood supply.  Sclerotherapy. Medicine is injected into the hemorrhoids to shrink them.  Infrared coagulation. A type of light energy is used to get rid of the hemorrhoids.  Hemorrhoidectomy surgery. The hemorrhoids are surgically removed, and the veins that supply them are tied off.  Stapled hemorrhoidopexy surgery. The surgeon staples the base of the hemorrhoid to the rectal wall. Follow these instructions at home: Eating and drinking  Eat foods that have a lot of fiber in them, such as whole grains, beans, nuts, fruits, and vegetables.  Ask your health care provider about taking products that have added fiber (fiber supplements).  Reduce the amount of fat in your diet. You can do this by eating low-fat dairy products, eating less red meat, and avoiding processed foods.  Drink enough fluid to keep your urine pale yellow.   Managing pain and swelling  Take warm sitz baths for 20 minutes, 3-4 times a day to ease pain and discomfort. You may do this in a bathtub or using a portable sitz bath that fits over the toilet.  If directed, apply ice to the  affected area. Using ice packs between sitz baths may be helpful. ? Put ice in a plastic bag. ? Place a towel between your skin and the bag. ? Leave the ice on for 20 minutes, 2-3 times a day.   General instructions  Take over-the-counter and prescription medicines only as told by your health care provider.  Use medicated creams or suppositories as told.  Get regular exercise. Ask your health care provider how much and what kind of exercise is best for you. In general, you should do moderate exercise for at least 30 minutes on most days of the week (150 minutes each week). This can include activities such as walking, biking, or yoga.  Go to the bathroom when you have the urge to have a bowel movement. Do not wait.  Avoid straining to have bowel movements.  Keep the anal area dry and clean. Use wet toilet paper or moist towelettes after a bowel movement.  Do not sit on the toilet for long periods of time. This increases blood pooling and pain.  Keep all follow-up visits as told by your health care provider. This is important. Contact a health care provider if you have:  Increasing pain and swelling that are not controlled by treatment or medicine.  Difficulty having a bowel movement, or you are unable to have a bowel movement.  Pain or inflammation outside the area of the hemorrhoids. Get help right away if you have:  Uncontrolled bleeding from your rectum. Summary  Hemorrhoids are swollen veins in and around the rectum or anus.  Most hemorrhoids can be managed with home treatments such as diet and lifestyle changes.  Taking warm sitz baths can help ease pain and discomfort.  In severe cases, procedures or surgery can be done to shrink or remove the hemorrhoids. This information is not intended to replace advice given to you by your health care provider. Make sure you discuss any questions you have  with your health care provider. Document Revised: 07/15/2018 Document Reviewed:  07/08/2017 Elsevier Patient Education  [place].

## 2020-05-01 NOTE — Anesthesia Procedure Notes (Signed)
Date/Time: 05/01/2020 8:30 AM Performed by: Jonna Munro, CRNA Pre-anesthesia Checklist: Patient identified, Emergency Drugs available, Suction available and Patient being monitored Patient Re-evaluated:Patient Re-evaluated prior to induction Oxygen Delivery Method: Nasal cannula Induction Type: IV induction Placement Confirmation: positive ETCO2

## 2020-05-01 NOTE — Transfer of Care (Signed)
Immediate Anesthesia Transfer of Care Note  Patient: Travis Farmer  Procedure(s) Performed: COLONOSCOPY WITH PROPOFOL (N/A ) POLYPECTOMY INTESTINAL  Patient Location: PACU  Anesthesia Type:General  Level of Consciousness: awake, alert , oriented and patient cooperative  Airway & Oxygen Therapy: Patient Spontanous Breathing  Post-op Assessment: Report given to RN, Post -op Vital signs reviewed and stable and Patient moving all extremities X 4  Post vital signs: Reviewed and stable  Last Vitals:  Vitals Value Taken Time  BP    Temp    Pulse    Resp    SpO2      Last Pain:  Vitals:   05/01/20 0744  TempSrc: Oral  PainSc: 4       Patients Stated Pain Goal: 7 (70/78/67 5449)  Complications: No complications documented.

## 2020-05-01 NOTE — Anesthesia Preprocedure Evaluation (Addendum)
Anesthesia Evaluation  Patient identified by MRN, date of birth, ID band Patient awake    Reviewed: Allergy & Precautions, NPO status , Patient's Chart, lab work & pertinent test results  History of Anesthesia Complications Negative for: history of anesthetic complications  Airway Mallampati: II  TM Distance: >3 FB Neck ROM: Full    Dental  (+) Dental Advisory Given, Teeth Intact   Pulmonary neg pulmonary ROS,    Pulmonary exam normal breath sounds clear to auscultation       Cardiovascular Exercise Tolerance: Good Normal cardiovascular exam Rhythm:Regular Rate:Normal     Neuro/Psych Concussion  negative psych ROS   GI/Hepatic negative GI ROS, (+)     substance abuse  alcohol use,   Endo/Other  negative endocrine ROS  Renal/GU negative Renal ROS  negative genitourinary   Musculoskeletal negative musculoskeletal ROS (+)   Abdominal   Peds negative pediatric ROS (+)  Hematology negative hematology ROS (+)   Anesthesia Other Findings   Reproductive/Obstetrics negative OB ROS                            Anesthesia Physical Anesthesia Plan  ASA: II  Anesthesia Plan: General   Post-op Pain Management:    Induction: Intravenous  PONV Risk Score and Plan: TIVA  Airway Management Planned: Nasal Cannula and Natural Airway  Additional Equipment:   Intra-op Plan:   Post-operative Plan:   Informed Consent: I have reviewed the patients History and Physical, chart, labs and discussed the procedure including the risks, benefits and alternatives for the proposed anesthesia with the patient or authorized representative who has indicated his/her understanding and acceptance.     Dental advisory given  Plan Discussed with: CRNA and Surgeon  Anesthesia Plan Comments:         Anesthesia Quick Evaluation

## 2020-05-01 NOTE — H&P (Signed)
Travis Farmer is an 50 y.o. male.   Chief Complaint: Colorectal cancer screening HPI: 50 year old male, coming for screening colonoscopy. The patient has never had a colonoscopy in the past.  The patient denies having any complaints such as melena, hematochezia, abdominal pain or distention, change in her bowel movement consistency or frequency, no changes in her weight recently.  Patient believes that his grandmother was diagnosed with colon cancer, he believes this was after age 66.    Past Medical History:  Diagnosis Date  . Concussion     Past Surgical History:  Procedure Laterality Date  . ADENOIDECTOMY     as child  . WISDOM TOOTH EXTRACTION      Family History  Problem Relation Age of Onset  . Colon cancer Other    Social History:  reports that he has never smoked. He has never used smokeless tobacco. He reports current alcohol use. He reports that he does not use drugs.  Allergies:  Allergies  Allergen Reactions  . Penicillins Shortness Of Breath and Rash    Medications Prior to Admission  Medication Sig Dispense Refill  . buPROPion (WELLBUTRIN XL) 150 MG 24 hr tablet Take 150 mg by mouth daily.    . methocarbamol (ROBAXIN) 500 MG tablet Take 500 mg by mouth daily as needed for muscle spasms.    . Multiple Vitamins-Minerals (CENTRUM ADULTS) TABS Take 1 tablet by mouth daily.    . polyethylene glycol-electrolytes (TRILYTE) 420 g solution Take 4,000 mLs by mouth as directed. 4000 mL 0  . divalproex (DEPAKOTE ER) 500 MG 24 hr tablet Take 1 tablet (500 mg total) by mouth daily. (Patient not taking: No sig reported) 30 tablet 2  . ondansetron (ZOFRAN ODT) 4 MG disintegrating tablet Take 1 tablet (4 mg total) by mouth every 8 (eight) hours as needed for nausea. (Patient not taking: No sig reported) 10 tablet 0    Results for orders placed or performed during the hospital encounter of 04/29/20 (from the past 48 hour(s))  SARS CORONAVIRUS 2 (TAT 6-24 HRS) Nasopharyngeal  Nasopharyngeal Swab     Status: None   Collection Time: 04/29/20  9:20 AM   Specimen: Nasopharyngeal Swab  Result Value Ref Range   SARS Coronavirus 2 NEGATIVE NEGATIVE    Comment: (NOTE) SARS-CoV-2 target nucleic acids are NOT DETECTED.  The SARS-CoV-2 RNA is generally detectable in upper and lower respiratory specimens during the acute phase of infection. Negative results do not preclude SARS-CoV-2 infection, do not rule out co-infections with other pathogens, and should not be used as the sole basis for treatment or other patient management decisions. Negative results must be combined with clinical observations, patient history, and epidemiological information. The expected result is Negative.  Fact Sheet for Patients: SugarRoll.be  Fact Sheet for Healthcare Providers: https://www.woods-mathews.com/  This test is not yet approved or cleared by the [place] FDA and  has been authorized for detection and/or diagnosis of SARS-CoV-2 by FDA under an Emergency Use Authorization (EUA). This EUA will remain  in effect (meaning this test can be used) for the duration of the COVID-19 declaration under Se ction 564(b)(1) of the Act, 21 U.S.C. section 360bbb-3(b)(1), unless the authorization is terminated or revoked sooner.  Performed at [facility], [place] [address] [place], [place] [address]    No results found.  Review of Systems  Constitutional: Negative.   HENT: Negative.   Eyes: Negative.   Respiratory: Negative.   Cardiovascular: Negative.   Gastrointestinal: Negative.  Endocrine: Negative.   Genitourinary: Negative.   Musculoskeletal: Negative.   Skin: Negative.   Allergic/Immunologic: Negative.   Neurological: Negative.   Hematological: Negative.   Psychiatric/Behavioral: Negative.     Blood pressure (!) 164/99, pulse 92, temperature 98.1 F (36.7 C), temperature source Oral, resp. rate 18, height 5\' 11"  (1.803  m), weight 76.2 kg, SpO2 99 %. Physical Exam  GENERAL: The patient is AO x3, in no acute distress. HEENT: Head is normocephalic and atraumatic. EOMI are intact. Mouth is well hydrated and without lesions. NECK: Supple. No masses LUNGS: Clear to auscultation. No presence of rhonchi/wheezing/rales. Adequate chest expansion HEART: RRR, normal s1 and s2. ABDOMEN: Soft, nontender, no guarding, no peritoneal signs, and nondistended. BS +. No masses. EXTREMITIES: Without any cyanosis, clubbing, rash, lesions or edema. NEUROLOGIC: AOx3, no focal motor deficit. SKIN: no jaundice, no rashes   Assessment/Plan 50 year old male, coming for screening colonoscopy. The patient is at average risk for colorectal cancer.  We will proceed with colonoscopy today.   Harvel Quale, MD 05/01/2020, 8:19 AM

## 2020-05-03 LAB — SURGICAL PATHOLOGY

## 2020-05-06 ENCOUNTER — Encounter (INDEPENDENT_AMBULATORY_CARE_PROVIDER_SITE_OTHER): Payer: Self-pay | Admitting: *Deleted

## 2020-05-07 ENCOUNTER — Encounter (HOSPITAL_COMMUNITY): Payer: Self-pay | Admitting: Gastroenterology

## 2020-05-20 ENCOUNTER — Encounter: Payer: Self-pay | Admitting: Neurology

## 2020-05-20 ENCOUNTER — Ambulatory Visit: Payer: 59 | Admitting: Neurology

## 2020-05-20 ENCOUNTER — Telehealth: Payer: Self-pay | Admitting: Emergency Medicine

## 2020-05-20 VITALS — BP 172/90 | HR 85 | Ht 71.0 in | Wt 160.0 lb

## 2020-05-20 DIAGNOSIS — F0781 Postconcussional syndrome: Secondary | ICD-10-CM | POA: Diagnosis not present

## 2020-05-20 DIAGNOSIS — R4020 Unspecified coma: Secondary | ICD-10-CM

## 2020-05-20 NOTE — Progress Notes (Signed)
[facility] [address] [place] [address] [phone]       OFFICE FOLLOW UP VISIT NOTE  [name] Date of Birth:  08-08-1970 Medical Record Number:  [id number]   Referring MD: Delphina Cahill  Reason for Referral: Postconcussion symptoms  HPI: Initial visit 03/11/2020 Travis Farmer is a pleasant 50 year old Caucasian male seen today for initial office consultation visit.  History is obtained from the patient and his wife as well as review of referral notes.  No relevant imaging studies are available for review.  Patient states that he has had some multifocal symptoms going of episode of passing out and facial trauma on 08/09/2019.  He states he was alone at home and was probably working out in the garden.  He woke up at home disoriented and confused at trouble texting his family members called him on the phone he was not acting right.  He had sustained bruising on his face.  There is no tongue bite or other injury noted.  Patient denies any headache or soreness at that time.  He was seen at [facility] emergency room on 08/09/2019 where he actually stated to Dr. Noemi Chapel, ER, MD that he was putting a sprinkler system in his garden and using a post whole digger when he accidentally stuck it on his face on the left side of his cheek which potentially caused him to pass out.  He had very little memory for 24 hours after that event and was at home alone.  Patient did describe a headache after that.  CT scan of the head on 08/09/2019 personally reviewed by me was normal.  Patient states since then he has had trouble sleeping trouble focusing and difficulty working at his job.  He is feels he cannot concentrate for long and has easy distractibility and trouble with multitasking.  He also gets headaches off and on as well as neck pain.  He feels dizzy at times even emotionally labile and starts crying.  He denies feeling depressed.  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  He also had 1 concussion which he got from [place].  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  He has no prior history of seizures, stroke, TIA, migraines or significant neurological problems. Update 05/20/2020 : He returns for follow-up after last visit 2 months ago.  He states all of his symptoms resolved.  His headaches have no longer bothersome and his ability to concentrate and multitask is improving as well.  Memory is also improved.  Patient was started by me on Depakote at last visit but he took it only for 10 days and discontinued as it made him have trouble sleeping.  His primary care physician put him on Wellbutrin which she seems to be tolerating well and seems to have helped.  He had EEG done on 03/26/2020 which was normal.  MRI scan of the brain done on 03/27/2020 was also unremarkable.  He has no new complaints today. ROS:   14 system review of systems is positive for no complaints today and all other symptoms negative  PMH:  Past Medical History:  Diagnosis Date  . Concussion     Social History:  Social History   Socioeconomic History  . Marital status: Married    Spouse name: Caryl Pina  . Number of children:  Not on file  . Years of education: Not on file  . Highest education level: Not on file  Occupational History  . Occupation: full time  Tobacco Use  . Smoking status: Never Smoker  . Smokeless tobacco: Never Used  Vaping Use  . Vaping Use: Never used  Substance and Sexual Activity  . Alcohol use: Yes    Comment: Occassional  . Drug use: Never  . Sexual activity: Not on file  Other Topics Concern  . Not on file  Social History Narrative   Lives with wife and 2 daughter   Right Handed   Drinks 2-3 cups caffeine daily   Social Determinants of Health   Financial Resource Strain: Not on file  Food Insecurity: Not on file   Transportation Needs: Not on file  Physical Activity: Not on file  Stress: Not on file  Social Connections: Not on file  Intimate Partner Violence: Not on file    Medications:   Current Outpatient Medications on File Prior to Visit  Medication Sig Dispense Refill  . buPROPion (WELLBUTRIN XL) 150 MG 24 hr tablet Take 150 mg by mouth daily.    . methocarbamol (ROBAXIN) 500 MG tablet Take 500 mg by mouth daily as needed for muscle spasms.    . Multiple Vitamins-Minerals (CENTRUM ADULTS) TABS Take 1 tablet by mouth daily.    . divalproex (DEPAKOTE ER) 500 MG 24 hr tablet Take 1 tablet (500 mg total) by mouth daily. 30 tablet 2  . ondansetron (ZOFRAN ODT) 4 MG disintegrating tablet Take 1 tablet (4 mg total) by mouth every 8 (eight) hours as needed for nausea. 10 tablet 0   Current Facility-Administered Medications on File Prior to Visit  Medication Dose Route Frequency Provider Last Rate Last Admin  . gadobenate dimeglumine (MULTIHANCE) injection 15 mL  15 mL Intravenous Once PRN Garvin Fila, MD        Allergies:   Allergies  Allergen Reactions  . Penicillins Shortness Of Breath and Rash    Physical Exam General: well developed, well nourished middle-aged Caucasian male, seated, in no evident distress Head: head normocephalic and atraumatic.   Neck: supple with no carotid or supraclavicular bruits Cardiovascular: regular rate and rhythm, no murmurs Musculoskeletal: no deformity Skin:  no rash/petichiae Vascular:  Normal pulses all extremities  Neurologic Exam Mental Status: Awake and fully alert. Oriented to place and time. Recent and remote memory intact. Attention span, concentration and fund of knowledge appropriate. Mood and affect appropriate.  Mini-Mental status exam score last visit 03/11/20 28/30 with 1 deficit in recall and attention calculation. Today recall 3/3.  Clock drawing 4/4.  Able to name 14 animals which can walk on  4 legs.   Cranial Nerves: Fundoscopic exam  reveals sharp disc margins. Pupils equal, briskly reactive to light. Extraocular movements full without nystagmus. Visual fields full to confrontation. Hearing intact. Facial sensation intact. Face, tongue, palate moves normally and symmetrically.  Motor: Normal bulk and tone. Normal strength in all tested extremity muscles. Sensory.: intact to touch , pinprick , position and vibratory sensation.  Coordination: Rapid alternating movements normal in all extremities. Finger-to-nose and heel-to-shin performed accurately bilaterally. Gait and Station: Arises from chair without difficulty. Stance is normal. Gait demonstrates normal stride length and balance . Able to heel, toe and tandem walk without difficulty.  Reflexes: 1+ and symmetric. Toes downgoing.       ASSESSMENT: 50 year old patient with episode of brief loss of consciousness with facial trauma having symptoms consistent with  postconcussion syndrome following episode in June 2021.  He also has underlying stress and anxiety which could be contributing.  Brain MRI and EEG were unremarkable and his symptoms appear to have resolved     PLAN: I had a long discussion with the patient and his wife regarding his episode of brief loss of consciousness and followed by memory loss and multifocal symptoms being of unclear etiology possibly unwitnessed seizure versus postconcussion symptoms all of which now appear to have improved.  Brain imaging and EEG have been unremarkable..  I also encouraged him to increase participation in regular activities for stress relaxation like meditation yoga and exercises as well as participate in cognitively challenging activities like solving crossword puzzles, playing bridge and sodoku.  No routine scheduled follow-up appointment with me is necessary but he can be referred back in the future as needed.  Greater than 50% time during this 25-minute consultation visit was spent on counseling and coordination of care about  his multifocal symptoms and answering questions. Antony Contras, MD Note: This document was prepared with digital dictation and possible smart phrase technology. Any transcriptional errors that result from this process are unintentional.

## 2020-05-20 NOTE — Patient Instructions (Addendum)
I had a long discussion with the patient and his wife regarding his episode of brief loss of consciousness and followed by memory loss and multifocal symptoms being of unclear etiology possibly unwitnessed seizure versus postconcussion symptoms all of which now appear to have improved.  Brain imaging and EEG have been unremarkable..  I also encouraged him to increase participation in regular activities for stress relaxation like meditation yoga and exercises as well as participate in cognitively challenging activities like solving crossword puzzles, playing bridge and sodoku.  No routine scheduled follow-up appointment with me is necessary but he can be referred back in the future as needed.

## 2020-05-20 NOTE — Telephone Encounter (Signed)
Patient had checked out before receiving AVS.  LVM on patient's mobile number that Dr. Leonie Man did not recommend any routine follow up appointment be made and he could follow up if there were any concerns or he felt he needed to be seen.

## 2020-08-29 ENCOUNTER — Ambulatory Visit: Admission: EM | Admit: 2020-08-29 | Discharge: 2020-08-29 | Payer: 59

## 2020-08-29 ENCOUNTER — Encounter: Payer: Self-pay | Admitting: Emergency Medicine

## 2020-08-29 ENCOUNTER — Other Ambulatory Visit: Payer: Self-pay

## 2020-08-29 ENCOUNTER — Emergency Department (HOSPITAL_COMMUNITY)
Admission: EM | Admit: 2020-08-29 | Discharge: 2020-08-29 | Disposition: A | Discharge status: Home / Self Care | Payer: 59 | Attending: Emergency Medicine | Admitting: Emergency Medicine

## 2020-08-29 ENCOUNTER — Emergency Department (HOSPITAL_COMMUNITY): Payer: 59

## 2020-08-29 DIAGNOSIS — R112 Nausea with vomiting, unspecified: Secondary | ICD-10-CM

## 2020-08-29 DIAGNOSIS — R1012 Left upper quadrant pain: Secondary | ICD-10-CM

## 2020-08-29 DIAGNOSIS — R000 Tachycardia, unspecified: Secondary | ICD-10-CM | POA: Diagnosis not present

## 2020-08-29 DIAGNOSIS — R7401 Elevation of levels of liver transaminase levels: Secondary | ICD-10-CM | POA: Diagnosis not present

## 2020-08-29 DIAGNOSIS — R748 Abnormal levels of other serum enzymes: Secondary | ICD-10-CM

## 2020-08-29 DIAGNOSIS — Z20822 Contact with and (suspected) exposure to covid-19: Secondary | ICD-10-CM | POA: Insufficient documentation

## 2020-08-29 DIAGNOSIS — R1032 Left lower quadrant pain: Secondary | ICD-10-CM | POA: Diagnosis not present

## 2020-08-29 LAB — CBC WITH DIFFERENTIAL/PLATELET
Abs Immature Granulocytes: 0.02 10*3/uL (ref 0.00–0.07)
Basophils Absolute: 0 10*3/uL (ref 0.0–0.1)
Basophils Relative: 1 %
Eosinophils Absolute: 0 10*3/uL (ref 0.0–0.5)
Eosinophils Relative: 0 %
HCT: 47.4 % (ref 39.0–52.0)
Hemoglobin: 16.5 g/dL (ref 13.0–17.0)
Immature Granulocytes: 0 %
Lymphocytes Relative: 35 %
Lymphs Abs: 2.1 10*3/uL (ref 0.7–4.0)
MCH: 31.7 pg (ref 26.0–34.0)
MCHC: 34.8 g/dL (ref 30.0–36.0)
MCV: 91.2 fL (ref 80.0–100.0)
Monocytes Absolute: 0.5 10*3/uL (ref 0.1–1.0)
Monocytes Relative: 9 %
Neutro Abs: 3.2 10*3/uL (ref 1.7–7.7)
Neutrophils Relative %: 55 %
Platelets: 232 10*3/uL (ref 150–400)
RBC: 5.2 MIL/uL (ref 4.22–5.81)
RDW: 11.7 % (ref 11.5–15.5)
WBC: 5.8 10*3/uL (ref 4.0–10.5)
nRBC: 0 % (ref 0.0–0.2)

## 2020-08-29 LAB — URINALYSIS, ROUTINE W REFLEX MICROSCOPIC
Bilirubin Urine: NEGATIVE
Glucose, UA: NEGATIVE mg/dL
Hgb urine dipstick: NEGATIVE
Ketones, ur: 5 mg/dL — AB
Leukocytes,Ua: NEGATIVE
Nitrite: NEGATIVE
Protein, ur: NEGATIVE mg/dL
Specific Gravity, Urine: 1.008 (ref 1.005–1.030)
pH: 7 (ref 5.0–8.0)

## 2020-08-29 LAB — COMPREHENSIVE METABOLIC PANEL
ALT: 77 U/L — ABNORMAL HIGH (ref 0–44)
AST: 103 U/L — ABNORMAL HIGH (ref 15–41)
Albumin: 4.3 g/dL (ref 3.5–5.0)
Alkaline Phosphatase: 62 U/L (ref 38–126)
Anion gap: 11 (ref 5–15)
BUN: 12 mg/dL (ref 6–20)
CO2: 30 mmol/L (ref 22–32)
Calcium: 8.4 mg/dL — ABNORMAL LOW (ref 8.9–10.3)
Chloride: 96 mmol/L — ABNORMAL LOW (ref 98–111)
Creatinine, Ser: 0.86 mg/dL (ref 0.61–1.24)
GFR, Estimated: >60 mL/min (ref >60)
Glucose, Bld: 113 mg/dL — ABNORMAL HIGH (ref 70–99)
Potassium: 4 mmol/L (ref 3.5–5.1)
Sodium: 137 mmol/L (ref 135–145)
Total Bilirubin: 1.3 mg/dL — ABNORMAL HIGH (ref 0.3–1.2)
Total Protein: 7 g/dL (ref 6.5–8.1)

## 2020-08-29 LAB — RESP PANEL BY RT-PCR (FLU A&B, COVID) ARPGX2
Influenza A by PCR: NEGATIVE
Influenza B by PCR: NEGATIVE
SARS Coronavirus 2 by RT PCR: NEGATIVE

## 2020-08-29 LAB — LIPASE, BLOOD: Lipase: 59 U/L — ABNORMAL HIGH (ref 11–51)

## 2020-08-29 MED ORDER — SODIUM CHLORIDE 0.9 % IV BOLUS
500.0000 mL | Freq: Once | INTRAVENOUS | Status: AC
  Administered 2020-08-29: 17:00:00 500 mL via INTRAVENOUS

## 2020-08-29 MED ORDER — DICYCLOMINE HCL 10 MG/ML IM SOLN
20.0000 mg | Freq: Once | INTRAMUSCULAR | Status: AC
  Administered 2020-08-29: 17:00:00 20 mg via INTRAMUSCULAR
  Filled 2020-08-29: qty 2

## 2020-08-29 MED ORDER — ONDANSETRON 4 MG PO TBDP
4.0000 mg | ORAL_TABLET | Freq: Once | ORAL | Status: DC
  Filled 2020-08-29: qty 1

## 2020-08-29 MED ORDER — SODIUM CHLORIDE 0.9 % IV SOLN
25.0000 mg | Freq: Once | INTRAVENOUS | Status: AC
  Administered 2020-08-29: 17:00:00 25 mg via INTRAVENOUS
  Filled 2020-08-29: qty 1

## 2020-08-29 MED ORDER — ONDANSETRON 4 MG PO TBDP: 4.0000 mg | ORAL_TABLET | Freq: Three times a day (TID) | ORAL | 0 refills | Status: DC | PRN

## 2020-08-29 MED ORDER — IOHEXOL 300 MG/ML  SOLN
100.0000 mL | Freq: Once | INTRAMUSCULAR | Status: AC | PRN
  Administered 2020-08-29: 100 mL via INTRAVENOUS

## 2020-08-29 MED ORDER — FAMOTIDINE 40 MG PO TABS: 40.0000 mg | ORAL_TABLET | Freq: Every day | ORAL | 0 refills | Status: DC

## 2020-08-29 NOTE — ED Triage Notes (Signed)
Left sided abdominal pain for 2 days

## 2020-08-29 NOTE — ED Provider Notes (Signed)
[facility] Provider Note   CSN: [id number] Arrival date & time: 08/29/20  1536     History No chief complaint on file.   Travis Farmer is a 50 y.o. male.  HPI  Patient with no significant medical history presents to the emergency department with chief complaint of left lower quadrant pain.  Patient states pain started 2 days ago, came on suddenly, pain does not radiate, he has associated nausea, vomiting, difficulty with urination, constipation, he denies seeing hematemesis, coffee ground emesis, denies dysuria, hematuria, denies flank pain.  Patient has no significant abdominal history, no history of kidney stones, UTIs, diverticulitis, pancreatitis, no history of bowel obstructions, states he still passing flatus.  Patient states he has never had this in the past, he denies  alleviating factors.  Patient does not endorse fevers, chills, chest pain, shortness of breath, worsening pedal edema.  Past Medical History:  Diagnosis Date   Concussion     There are no problems to display for this patient.   Past Surgical History:  Procedure Laterality Date   ADENOIDECTOMY     as child   COLONOSCOPY WITH PROPOFOL N/A 05/01/2020   Procedure: COLONOSCOPY WITH PROPOFOL;  Surgeon: Harvel Quale, MD;  Location: [facility];  Service: Gastroenterology;  Laterality: N/A;  AM   POLYPECTOMY  05/01/2020   Procedure: POLYPECTOMY INTESTINAL;  Surgeon: Harvel Quale, MD;  Location: [facility];  Service: Gastroenterology;;  rectal colon polyp    WISDOM TOOTH EXTRACTION         Family History  Problem Relation Age of Onset   Colon cancer Other     Social History   Tobacco Use   Smoking status: Never   Smokeless tobacco: Never  Vaping Use   Vaping Use: Never used  Substance Use Topics   Alcohol use: Yes    Comment: Occassional   Drug use: Never    Home Medications Prior to Admission medications   Medication Sig Start Date End Date  Taking? Authorizing Provider  buPROPion (WELLBUTRIN XL) 150 MG 24 hr tablet Take 150 mg by mouth daily. 04/08/20   [provider]  methocarbamol (ROBAXIN) 500 MG tablet Take 500 mg by mouth daily as needed for muscle spasms. 04/08/20   [provider]  Multiple Vitamins-Minerals (CENTRUM ADULTS) TABS Take 1 tablet by mouth daily.    [provider]    Allergies    Penicillins  Review of Systems   Review of Systems  Constitutional:  Negative for chills and fever.  HENT:  Negative for congestion.   Respiratory:  Negative for shortness of breath.   Cardiovascular:  Negative for chest pain.  Gastrointestinal:  Positive for abdominal pain, constipation, nausea and vomiting.  Genitourinary:  Positive for difficulty urinating. Negative for enuresis and flank pain.  Musculoskeletal:  Negative for myalgias.  Skin:  Negative for rash.  Neurological:  Negative for headaches.  Hematological:  Does not bruise/bleed easily.   Physical Exam Updated Vital Signs BP (!) 145/89   Pulse (!) 108   Temp 98.6 F (37 C) (Oral)   Resp (!) 21   SpO2 94%   Physical Exam Vitals and nursing note reviewed.  Constitutional:      General: He is not in acute distress.    Appearance: He is not ill-appearing.  HENT:     Head: Normocephalic and atraumatic.     Nose: No congestion.  Eyes:     Conjunctiva/sclera: Conjunctivae normal.  Cardiovascular:  Rate and Rhythm: Regular rhythm. Tachycardia present.     Pulses: Normal pulses.     Heart sounds: No murmur heard.   No friction rub. No gallop.  Pulmonary:     Effort: No respiratory distress.     Breath sounds: No wheezing, rhonchi or rales.  Abdominal:     General: There is no distension.     Palpations: Abdomen is soft.     Tenderness: There is abdominal tenderness. There is no right CVA tenderness or left CVA tenderness.     Comments: Abdomen visualized is nondistended, normal bowel sounds, dull to percussion, he had  tenderness to palpation in his left lower quadrant, no guarding, rebound tenderness, peritoneal sign, negative Murphy sign or McBurney point no CVA tenderness.  Musculoskeletal:     Right lower leg: No edema.     Left lower leg: No edema.  Skin:    General: Skin is warm and dry.  Neurological:     Mental Status: He is alert.  Psychiatric:        Mood and Affect: Mood normal.    ED Results / Procedures / Treatments   Labs (all labs ordered are listed, but only abnormal results are displayed) Labs Reviewed  COMPREHENSIVE METABOLIC PANEL - Abnormal; Notable for the following components:      Result Value   Chloride 96 (*)    Glucose, Bld 113 (*)    Calcium 8.4 (*)    AST 103 (*)    ALT 77 (*)    Total Bilirubin 1.3 (*)    All other components within normal limits  LIPASE, BLOOD - Abnormal; Notable for the following components:   Lipase 59 (*)    All other components within normal limits  RESP PANEL BY RT-PCR (FLU A&B, COVID) ARPGX2  CBC WITH DIFFERENTIAL/PLATELET  URINALYSIS, ROUTINE W REFLEX MICROSCOPIC    EKG None  Radiology No results found.  Procedures Procedures   Medications Ordered in ED Medications  ondansetron (ZOFRAN-ODT) disintegrating tablet 4 mg (4 mg Oral Patient Refused/Not Given 08/29/20 1632)  dicyclomine (BENTYL) injection 20 mg (20 mg Intramuscular Given 08/29/20 1632)  sodium chloride 0.9 % bolus 500 mL (0 mLs Intravenous Stopped 08/29/20 1822)  promethazine (PHENERGAN) 25 mg in sodium chloride 0.9 % 50 mL IVPB (0 mg Intravenous Stopped 08/29/20 1736)    ED Course  I have reviewed the triage vital signs and the nursing notes.  Pertinent labs & imaging results that were available during my care of the patient were reviewed by me and considered in my medical decision making (see chart for details).    MDM Rules/Calculators/A&P                         Initial impression-patient presents with left lower quadrant pain.  He is alert, does not appear to  be in acute distress, vital signs are for tachycardia.  Will obtain basic lab work-up, provide patient with Bentyl and reassess.  Work-up-CBC unremarkable, CMP shows hyperglycemia of 113, elevated liver enzymes AST 103, ALT 177, slightly elevated T bili 1.3.  Lipase is 59.  Respiratory panel negative  Reassessment-patient was reassessed after Phenergan and Bentyl, states she is feeling better, continues to have left lower quadrant pain on reevaluation.  Concerning for possible diverticulitis vs kidney stones will scan for further evaluation.  Rule out-low suspicion for systemic infection as patient is nontoxic-appearing, vital signs reassuring, no leukocytosis seen on CBC.  Low suspicion for abdominal abscess  as he is nontoxic-appearing, he has low risk factors i.e. has not had any surgeries recently.  Low suspicion for liver or gallbladder abnormalities as he has no right upper quadrant pain, he has slight increase in liver enzymes and alk phos but suspect this is transient.  will have him follow-up with PCP for further evaluation.  Low Suspicion for appendicitis as he has no right lower quadrant pain, etiology is atypical.  Low suspicion for bowel obstruction as abdomen is nondistended, dull to percussion, patient still passing flatus out difficulty.  Low suspicion for pancreatitis as patient has no epigastric pain, patient does have slightly elevated lipase but not 3 times the upper limit, he has no risk factors.  Suspect elevation secondary due to vomiting low suspicion for AAA or dissection as etiology is atypical, patient has low risk factors.  Plan-due to shift change patient with a handoff to margaux benter PAC she was provided HPI, current work-up, likely disposition.  Follow-up on CT abdomen pelvis and UA.  If all negative patient be discharged home, would like him to follow-up with PCP as he has slight elevated liver enzymes for a repeat CMP. Final Clinical Impression(s) / ED Diagnoses Final  diagnoses:  Left lower quadrant abdominal pain  Elevated liver enzymes    Rx / DC Orders ED Discharge Orders     None        Aron Baba 08/29/20 Rayford Halsted, MD 08/30/20 1036

## 2020-08-29 NOTE — Discharge Instructions (Addendum)
  Recommending further evaluation and management in the ED for severe abdominal pain.  Unable to treat obstruction here.  Patient in stable condition at discharge.  Escorted by sister.

## 2020-08-29 NOTE — ED Provider Notes (Signed)
Care assumed from Deno Etienne, PA-C, at shift change, please see their notes for full documentation of patient's complaint/HPI. Briefly, pt here with complaint of LUQ/LLQ pain with NBNB emesis for the past 2 days. Results so far show elevetd LFTs with AST 103, ALT 77, and T bili 1.3. Remainder of labs unremarkable. Awaiting U/A and CT scan. Plan is to dispo accordingly.   Physical Exam  BP 135/78   Pulse 100   Temp 98.6 F (37 C) (Oral)   Resp 19   SpO2 94%   Physical Exam Vitals and nursing note reviewed.  Constitutional:      Appearance: He is not ill-appearing.  HENT:     Head: Normocephalic and atraumatic.  Eyes:     Conjunctiva/sclera: Conjunctivae normal.  Cardiovascular:     Rate and Rhythm: Normal rate and regular rhythm.  Pulmonary:     Effort: Pulmonary effort is normal.     Breath sounds: Normal breath sounds.  Skin:    General: Skin is warm and dry.     Coloration: Skin is not jaundiced.  Neurological:     Mental Status: He is alert.    ED Course/Procedures     Procedures  Results for orders placed or performed during the hospital encounter of 08/29/20  Resp Panel by RT-PCR (Flu A&B, Covid) Nasopharyngeal Swab   Specimen: Nasopharyngeal Swab; Nasopharyngeal(NP) swabs in vial transport medium  Result Value Ref Range   SARS Coronavirus 2 by RT PCR NEGATIVE NEGATIVE   Influenza A by PCR NEGATIVE NEGATIVE   Influenza B by PCR NEGATIVE NEGATIVE  Comprehensive metabolic panel  Result Value Ref Range   Sodium 137 135 - 145 mmol/L   Potassium 4.0 3.5 - 5.1 mmol/L   Chloride 96 (L) 98 - 111 mmol/L   CO2 30 22 - 32 mmol/L   Glucose, Bld 113 (H) 70 - 99 mg/dL   BUN 12 6 - 20 mg/dL   Creatinine, Ser 0.86 0.61 - 1.24 mg/dL   Calcium 8.4 (L) 8.9 - 10.3 mg/dL   Total Protein 7.0 6.5 - 8.1 g/dL   Albumin 4.3 3.5 - 5.0 g/dL   AST 103 (H) 15 - 41 U/L   ALT 77 (H) 0 - 44 U/L   Alkaline Phosphatase 62 38 - 126 U/L   Total Bilirubin 1.3 (H) 0.3 - 1.2 mg/dL   GFR,  Estimated >60 >60 mL/min   Anion gap 11 5 - 15  CBC with Differential  Result Value Ref Range   WBC 5.8 4.0 - 10.5 K/uL   RBC 5.20 4.22 - 5.81 MIL/uL   Hemoglobin 16.5 13.0 - 17.0 g/dL   HCT 47.4 39.0 - 52.0 %   MCV 91.2 80.0 - 100.0 fL   MCH 31.7 26.0 - 34.0 pg   MCHC 34.8 30.0 - 36.0 g/dL   RDW 11.7 11.5 - 15.5 %   Platelets 232 150 - 400 K/uL   nRBC 0.0 0.0 - 0.2 %   Neutrophils Relative % 55 %   Neutro Abs 3.2 1.7 - 7.7 K/uL   Lymphocytes Relative 35 %   Lymphs Abs 2.1 0.7 - 4.0 K/uL   Monocytes Relative 9 %   Monocytes Absolute 0.5 0.1 - 1.0 K/uL   Eosinophils Relative 0 %   Eosinophils Absolute 0.0 0.0 - 0.5 K/uL   Basophils Relative 1 %   Basophils Absolute 0.0 0.0 - 0.1 K/uL   Immature Granulocytes 0 %   Abs Immature Granulocytes 0.02 0.00 - 0.07 K/uL  Lipase, blood  Result Value Ref Range   Lipase 59 (H) 11 - 51 U/L  Urinalysis, Routine w reflex microscopic  Result Value Ref Range   Color, Urine YELLOW YELLOW   APPearance CLEAR CLEAR   Specific Gravity, Urine 1.008 1.005 - 1.030   pH 7.0 5.0 - 8.0   Glucose, UA NEGATIVE NEGATIVE mg/dL   Hgb urine dipstick NEGATIVE NEGATIVE   Bilirubin Urine NEGATIVE NEGATIVE   Ketones, ur 5 (A) NEGATIVE mg/dL   Protein, ur NEGATIVE NEGATIVE mg/dL   Nitrite NEGATIVE NEGATIVE   Leukocytes,Ua NEGATIVE NEGATIVE   CT ABDOMEN PELVIS W CONTRAST  Result Date: 08/29/2020 CLINICAL DATA:  Acute left-sided abdominal pain. EXAM: CT ABDOMEN AND PELVIS WITH CONTRAST TECHNIQUE: Multidetector CT imaging of the abdomen and pelvis was performed using the standard protocol following bolus administration of intravenous contrast. CONTRAST:  161mL OMNIPAQUE IOHEXOL 300 MG/ML  SOLN COMPARISON:  None. FINDINGS: Lower chest: No acute abnormality. Hepatobiliary: Hepatic steatosis. No gallstones or biliary dilatation is noted. Pancreas: Unremarkable. No pancreatic ductal dilatation or surrounding inflammatory changes. Spleen: Normal in size without focal  abnormality. Adrenals/Urinary Tract: Adrenal glands appear normal. Left renal cysts are noted. No hydronephrosis or renal obstruction is noted. Urinary bladder is unremarkable. Stomach/Bowel: Stomach is within normal limits. Appendix appears normal. No evidence of bowel wall thickening, distention, or inflammatory changes. Vascular/Lymphatic: No significant vascular findings are present. No enlarged abdominal or pelvic lymph nodes. Reproductive: Prostate is unremarkable. Other: No abdominal wall hernia or abnormality. No abdominopelvic ascites. Musculoskeletal: No acute or significant osseous findings. IMPRESSION: Hepatic steatosis. No other abnormality seen in the abdomen or pelvis. Electronically Signed   By: Marijo Conception M.D.   On: 08/29/2020 19:59    MDM  U/A without signs of infection. CT scan without obvious findings besides hepatic steatosis which could account for elevated LFTs however given unexplained pain with LFTs will plan for outpatient ultrasound tomorrow of gallbladder. Pt also endorses heavy diet of spicy foods. Question GERD vs PUD. On reevaluation pt resting comfortably. Will discharge home at this time with Rx zofran and pepcid as well as PCP follow up. Pt in agreement with plan and stable for discharge home.   This note was prepared using Dragon voice recognition software and may include unintentional dictation errors due to the inherent limitations of voice recognition software.        Eustaquio Maize, PA-C 08/29/20 2026    Milton Ferguson, MD 08/30/20 1039

## 2020-08-29 NOTE — ED Triage Notes (Signed)
Vomiting since Tuesday.  Left sided abd pain.

## 2020-08-29 NOTE — ED Provider Notes (Signed)
[place]   [id number] 08/29/20 Arrival Time: 8502  CC: ABDOMINAL DISCOMFORT  SUBJECTIVE:  Travis Farmer is a 50 y.o. male who presents with complaint of abdominal discomfort that began 2 days ago.  Denies a precipitating event, trauma, close contacts with similar symptoms, recent travel or antibiotic use.  Localizes pain to LUQ  Describes as waxing and waning.  Has tried OTC medications without relief.  Denies aggravating factors.  Denies similar symptoms in the past.  Last BM few days ago.  Reports nausea, and vomiting several times daily since symptoms began.   Also reports dark urine.    Denies fever, chills, chest pain, SOB, diarrhea, hematochezia, melena, dysuria, difficulty urinating, increased frequency or urgency, flank pain, loss of bowel or bladder function  Denies past history of abdominal surgery.    ROS: As per HPI.  All other pertinent ROS negative.     Past Medical History:  Diagnosis Date   Concussion    Past Surgical History:  Procedure Laterality Date   ADENOIDECTOMY     as child   COLONOSCOPY WITH PROPOFOL N/A 05/01/2020   Procedure: COLONOSCOPY WITH PROPOFOL;  Surgeon: Harvel Quale, MD;  Location: [facility];  Service: Gastroenterology;  Laterality: N/A;  AM   POLYPECTOMY  05/01/2020   Procedure: POLYPECTOMY INTESTINAL;  Surgeon: Montez Morita, Quillian Quince, MD;  Location: [facility];  Service: Gastroenterology;;  rectal colon polyp    WISDOM TOOTH EXTRACTION     Allergies  Allergen Reactions   Penicillins Shortness Of Breath and Rash   Current Facility-Administered Medications on File Prior to Encounter  Medication Dose Route Frequency Provider Last Rate Last Admin   gadobenate dimeglumine (MULTIHANCE) injection 15 mL  15 mL Intravenous Once PRN Garvin Fila, MD       Current Outpatient Medications on File Prior to Encounter  Medication Sig Dispense Refill   buPROPion (WELLBUTRIN XL) 150 MG 24 hr tablet Take 150 mg by  mouth daily.     methocarbamol (ROBAXIN) 500 MG tablet Take 500 mg by mouth daily as needed for muscle spasms.     Multiple Vitamins-Minerals (CENTRUM ADULTS) TABS Take 1 tablet by mouth daily.     Social History   Socioeconomic History   Marital status: Married    Spouse name: ashley   Number of children: Not on file   Years of education: Not on file   Highest education level: Not on file  Occupational History   Occupation: full time  Tobacco Use   Smoking status: Never   Smokeless tobacco: Never  Vaping Use   Vaping Use: Never used  Substance and Sexual Activity   Alcohol use: Yes    Comment: Occassional   Drug use: Never   Sexual activity: Not on file  Other Topics Concern   Not on file  Social History Narrative   Lives with wife and 2 daughter   Right Handed   Drinks 2-3 cups caffeine daily   Social Determinants of Health   Financial Resource Strain: Not on file  Food Insecurity: Not on file  Transportation Needs: Not on file  Physical Activity: Not on file  Stress: Not on file  Social Connections: Not on file  Intimate Partner Violence: Not on file   Family History  Problem Relation Age of Onset   Colon cancer Other      OBJECTIVE:  Vitals:   08/29/20 1453  BP: (!) 131/92  Pulse: (!) 122  Resp: 16  Temp: 98.5 F (  36.9 C)  TempSrc: Oral  SpO2: 93%    General appearance: Alert; NAD, appears uncomfortable HEENT: NCAT.  Oropharynx clear.  Lungs: clear to auscultation bilaterally without adventitious breath sounds Heart: regular rate and rhythm.   Abdomen: soft, non-distended; normal active bowel sounds; TTP over LUQ, LT flank, and LLQ; nontender at McBurney's point; negative Murphy's sign; no guarding, dullness with percussion throughout abdomen Back: no CVA tenderness Extremities: no edema; symmetrical with no gross deformities Skin: warm and dry Neurologic: normal gait Psychological: alert and cooperative; normal mood and affect   ASSESSMENT &  PLAN:  1. Left upper quadrant abdominal pain   2. Non-intractable vomiting with nausea, unspecified vomiting type     Recommending further evaluation and management in the ED for severe abdominal pain.  Unable to treat obstruction here.  Patient in stable condition at discharge.  Escorted by sister.     Lestine Box, PA-C 08/29/20 1628

## 2020-08-29 NOTE — Discharge Instructions (Addendum)
Your CT scan was reassuring in the ED without any obvious findings however your liver function tests were slightly elevated. We have schedule an outpatient ultrasound for you tomorrow. Please see below for instructions on how to call to schedule a time slot for tomorrow.   Please pick up medications and take as prescribed. Your pain may also be related to acid reflux vs peptic ulcer disease. We treat both the same way with acid reducing medications. It is recommended that you decrease the amount of spicy foods in your diet as this may be contributing to your pain.   Please also follow up with your PCP regarding ED visit today. They will need to follow up on your elevated liver function tests and repeat these labs in 1-2 weeks if your ultrasound does not show any obvious findings.   If your ultrasound is positive for gallstones you will need to follow up with General Surgeon Dr. Constance Haw to discuss elective removal of your gallbladder. Attached is her information  Please call [phone] to schedule an appointment.   If your appointment is scheduled for a Saturday, Sunday or holiday, please go to the [facility] Emergency Department Registration Desk at least 15 minutes prior to your appointment time and tell them you are there for an ultrasound.     If your appointment is scheduled for a weekday (Monday-Friday), please go directly to the [facility] Radiology Department at least 15 minutes prior to your appointment time and tell them you are there for an ultrasound.  Please call [phone] with questions.

## 2020-08-29 NOTE — ED Notes (Signed)
Pt unable to urinate at this time.  

## 2020-08-30 ENCOUNTER — Other Ambulatory Visit (HOSPITAL_COMMUNITY): Payer: Self-pay | Admitting: Internal Medicine

## 2020-08-30 DIAGNOSIS — R109 Unspecified abdominal pain: Secondary | ICD-10-CM

## 2020-09-03 ENCOUNTER — Ambulatory Visit (HOSPITAL_COMMUNITY)
Admission: RE | Admit: 2020-09-03 | Discharge: 2020-09-03 | Disposition: A | Discharge status: Home / Self Care | Payer: 59 | Source: Ambulatory Visit | Attending: Internal Medicine | Admitting: Internal Medicine

## 2020-09-03 DIAGNOSIS — R109 Unspecified abdominal pain: Secondary | ICD-10-CM | POA: Insufficient documentation

## 2020-09-12 ENCOUNTER — Other Ambulatory Visit (HOSPITAL_COMMUNITY): Payer: Self-pay | Admitting: Internal Medicine

## 2020-09-12 DIAGNOSIS — R1032 Left lower quadrant pain: Secondary | ICD-10-CM

## 2020-09-17 ENCOUNTER — Ambulatory Visit
Admission: EM | Admit: 2020-09-17 | Discharge: 2020-09-17 | Disposition: A | Discharge status: Home / Self Care | Payer: 59 | Attending: Urgent Care | Admitting: Urgent Care

## 2020-09-17 ENCOUNTER — Encounter: Payer: Self-pay | Admitting: Emergency Medicine

## 2020-09-17 ENCOUNTER — Other Ambulatory Visit: Payer: Self-pay

## 2020-09-17 DIAGNOSIS — R21 Rash and other nonspecific skin eruption: Secondary | ICD-10-CM | POA: Diagnosis not present

## 2020-09-17 DIAGNOSIS — B880 Other acariasis: Secondary | ICD-10-CM

## 2020-09-17 MED ORDER — HYDROXYZINE HCL 25 MG PO TABS: 12.5000 mg | ORAL_TABLET | Freq: Three times a day (TID) | ORAL | 0 refills | Status: DC | PRN

## 2020-09-17 MED ORDER — PREDNISONE 20 MG PO TABS: ORAL_TABLET | ORAL | 0 refills | Status: DC

## 2020-09-17 NOTE — ED Provider Notes (Signed)
[place]   MRN: [id number] DOB: Jun 25, 1970  Subjective:   Travis Farmer is a 50 y.o. male presenting for 2-day history of acute onset persistent and worsening rash over the lower portion of his torso extending all the way down into his legs.  Patient has been clearing the field and has had multiple chiggers bite him.  Has been using multiple over-the-counter topical medications and Benadryl as well without any relief.  Would like aggressive management.  No current facility-administered medications for this encounter.  Current Outpatient Medications:    buPROPion (WELLBUTRIN XL) 150 MG 24 hr tablet, Take 150 mg by mouth daily., Disp: , Rfl:    Multiple Vitamins-Minerals (CENTRUM ADULTS) TABS, Take 1 tablet by mouth daily., Disp: , Rfl:    famotidine (PEPCID) 40 MG tablet, Take 1 tablet (40 mg total) by mouth daily., Disp: 30 tablet, Rfl: 0   methocarbamol (ROBAXIN) 500 MG tablet, Take 500 mg by mouth daily as needed for muscle spasms., Disp: , Rfl:    ondansetron (ZOFRAN ODT) 4 MG disintegrating tablet, Take 1 tablet (4 mg total) by mouth every 8 (eight) hours as needed for nausea or vomiting., Disp: 20 tablet, Rfl: 0  Facility-Administered Medications Ordered in Other Encounters:    gadobenate dimeglumine (MULTIHANCE) injection 15 mL, 15 mL, Intravenous, Once PRN, Garvin Fila, MD   Allergies  Allergen Reactions   Penicillins Shortness Of Breath and Rash    Past Medical History:  Diagnosis Date   Concussion      Past Surgical History:  Procedure Laterality Date   ADENOIDECTOMY     as child   COLONOSCOPY WITH PROPOFOL N/A 05/01/2020   Procedure: COLONOSCOPY WITH PROPOFOL;  Surgeon: Harvel Quale, MD;  [facility];  Service: Gastroenterology;  Laterality: N/A;  AM   POLYPECTOMY  05/01/2020   Procedure: POLYPECTOMY INTESTINAL;  Surgeon: Montez Morita, Quillian Quince, MD;  Location: [facility];  Service: Gastroenterology;;  rectal colon  polyp    WISDOM TOOTH EXTRACTION      Family History  Problem Relation Age of Onset   Colon cancer Other     Social History   Tobacco Use   Smoking status: Never   Smokeless tobacco: Never  Vaping Use   Vaping Use: Never used  Substance Use Topics   Alcohol use: Yes    Comment: Occassional   Drug use: Never    ROS   Objective:   Vitals: BP (!) 147/98 (BP Location: Right Arm)   Pulse 97   Temp 98.6 F (37 C) (Oral)   Resp 15   SpO2 98%   Physical Exam Constitutional:      General: He is not in acute distress.    Appearance: Normal appearance. He is well-developed and normal weight. He is not ill-appearing, toxic-appearing or diaphoretic.  HENT:     Head: Normocephalic and atraumatic.     Right Ear: External ear normal.     Left Ear: External ear normal.     Nose: Nose normal.     Mouth/Throat:     Pharynx: Oropharynx is clear.  Eyes:     General: No scleral icterus.       Right eye: No discharge.        Left eye: No discharge.     Extraocular Movements: Extraocular movements intact.     Pupils: Pupils are equal, round, and reactive to light.  Cardiovascular:     Rate and Rhythm: Normal rate.  Pulmonary:  Effort: Pulmonary effort is normal.  Musculoskeletal:     Cervical back: Normal range of motion.  Skin:    General: Skin is warm and dry.     Findings: Rash (multiple bite wounds with associated excoriations diffusely scattered over the lower portion of his torso, upper thighs and to a lesser extent the lower extremities) present.  Neurological:     Mental Status: He is alert and oriented to person, place, and time.  Psychiatric:        Mood and Affect: Mood normal.        Behavior: Behavior normal.        Thought Content: Thought content normal.        Judgment: Judgment normal.     Assessment and Plan :   PDMP not reviewed this encounter.  1. Rash and nonspecific skin eruption   2. Chigger bites     Recommended oral prednisone,  hydroxyzine.  Advised that he try to avoid the trigger as much as possible. Counseled patient on potential for adverse effects with medications prescribed/recommended today, ER and return-to-clinic precautions discussed, patient verbalized understanding.    Jaynee Eagles, PA-C 09/17/20 1118

## 2020-09-17 NOTE — ED Triage Notes (Signed)
Patient c/o rash that started last night.   Patient endorses " I've done a lot of yard work and I have chigger bites".   Patient endorses rash being present on buttocks, legs, and ABD.   Patient endorses itching.   Patient has tried cortisone, benadryl cream, and benadryl pill with no relief.

## 2020-09-20 ENCOUNTER — Ambulatory Visit (HOSPITAL_COMMUNITY)
Admission: RE | Admit: 2020-09-20 | Discharge: 2020-09-20 | Disposition: A | Discharge status: Home / Self Care | Payer: 59 | Source: Ambulatory Visit | Attending: Internal Medicine | Admitting: Internal Medicine

## 2020-09-20 ENCOUNTER — Encounter (HOSPITAL_COMMUNITY): Payer: Self-pay

## 2020-09-20 ENCOUNTER — Other Ambulatory Visit: Payer: Self-pay

## 2020-09-20 DIAGNOSIS — R1032 Left lower quadrant pain: Secondary | ICD-10-CM

## 2020-10-29 ENCOUNTER — Encounter (INDEPENDENT_AMBULATORY_CARE_PROVIDER_SITE_OTHER): Payer: Self-pay | Admitting: *Deleted

## 2020-10-31 ENCOUNTER — Telehealth (INDEPENDENT_AMBULATORY_CARE_PROVIDER_SITE_OTHER): Payer: Self-pay | Admitting: Gastroenterology

## 2020-10-31 NOTE — Telephone Encounter (Signed)
Mother called the office stated patient is to have a surveillance colonoscopy scheduled - please advise - ph# [phone]

## 2020-11-12 ENCOUNTER — Encounter (INDEPENDENT_AMBULATORY_CARE_PROVIDER_SITE_OTHER): Payer: Self-pay

## 2020-11-12 ENCOUNTER — Other Ambulatory Visit (INDEPENDENT_AMBULATORY_CARE_PROVIDER_SITE_OTHER): Payer: Self-pay

## 2020-11-12 DIAGNOSIS — Z86010 Personal history of colonic polyps: Secondary | ICD-10-CM

## 2020-11-27 NOTE — Patient Instructions (Signed)
Travis Farmer  11/27/2020     @PREFPERIOPPHARMACY @   Your procedure is scheduled on 12/03/2020.   Report to [facility] at  1115  A.M.   Call this number if you have problems the morning of surgery:  [phone]   Remember:  Follow the diet and prep instructions given to you by the office.    Take these medicines the morning of surgery with A SIP OF WATER                   wellbutrin, hydroxyzine.     Do not wear jewelry, make-up or nail polish.  Do not wear lotions, powders, or perfumes, or deodorant.  Do not shave 48 hours prior to surgery.  Men may shave face and neck.  Do not bring valuables to the hospital.  [facility] is not responsible for any belongings or valuables.  Contacts, dentures or bridgework may not be worn into surgery.  Leave your suitcase in the car.  After surgery it may be brought to your room.  For patients admitted to the hospital, discharge time will be determined by your treatment team.  Patients discharged the day of surgery will not be allowed to drive home and must have someone with them for 24 hours.    Special instructions:   DO NOT smoke tobacco or vape for 24 hours before your procedure.  Please read over the following fact sheets that you were given. Anesthesia Post-op Instructions and Care and Recovery After Surgery      Flexible Sigmoidoscopy, Care After This sheet gives you information about how to care for yourself after your procedure. Your health care provider may also give you more specific instructions. If you have problems or questions, contact your health care provider. What can I expect after the procedure? After the procedure, it is common to have: Cramping or pain in your abdomen. Bloating. A small amount of blood with your bowel movements. This may happen if a sample of tissue was removed for testing (biopsy). Follow these instructions at home: Eating and drinking  Drink enough fluid to keep your urine  pale yellow. Follow instructions from your health care provider about eating or drinking restrictions. Resume your normal diet as instructed by your health care provider. Avoid heavy or fried foods that are hard to digest. Activity  If you were given a medicine to help you relax (sedative) during the procedure, it can affect you for several hours. Do not drive or operate machinery until your health care provider says that it is safe. Rest as told by your health care provider. Return to your normal activities as told by your health care provider. Ask your health care provider what activities are safe for you. General instructions Take over-the-counter and prescription medicines only as told by your health care provider. Try walking around when you have cramps or feel bloated. Keep all follow-up visits as told by your health care provider. This is important. Contact a health care provider if: You have pain or cramping in your abdomen that gets worse or is not helped with medicine. You have a small amount of bleeding from your rectum that continues after 24 hours. You have nausea or vomiting. You feel weak or dizzy. You develop a fever. Get help right away if: You pass large blood clots or see a large amount of blood in the toilet after having a bowel movement. You have severe pain in your abdomen. You have  nausea or vomiting for more than 24 hours after the procedure. Summary After the procedure, you may have cramping or pain in your abdomen or you may have bloating. If you had a sample of tissue removed (biopsy), you may have a small amount of blood with your bowel movements. Resume your normal diet as instructed by your health care provider. Avoid heavy or fried foods that are hard to digest. Try walking around when you have cramps or feel bloated. Get help right away if you pass large blood clots or see a large amount of blood in the toilet after having a bowel movement. This information  is not intended to replace advice given to you by your health care provider. Make sure you discuss any questions you have with your health care provider. Document Revised: 02/13/2019 Document Reviewed: 02/13/2019 Elsevier Patient Education  2022 [place] After This sheet gives you information about how to care for yourself after your procedure. Your health care provider may also give you more specific instructions. If you have problems or questions, contact your health care provider. What can I expect after the procedure? After the procedure, it is common to have: Tiredness. Forgetfulness about what happened after the procedure. Impaired judgment for important decisions. Nausea or vomiting. Some difficulty with balance. Follow these instructions at home: For the time period you were told by your health care provider:   Rest as needed. Do not participate in activities where you could fall or become injured. Do not drive or use machinery. Do not drink alcohol. Do not take sleeping pills or medicines that cause drowsiness. Do not make important decisions or sign legal documents. Do not take care of children on your own. Eating and drinking Follow the diet that is recommended by your health care provider. Drink enough fluid to keep your urine pale yellow. If you vomit: Drink water, juice, or soup when you can drink without vomiting. Make sure you have little or no nausea before eating solid foods. General instructions Have a responsible adult stay with you for the time you are told. It is important to have someone help care for you until you are awake and alert. Take over-the-counter and prescription medicines only as told by your health care provider. If you have sleep apnea, surgery and certain medicines can increase your risk for breathing problems. Follow instructions from your health care provider about wearing your sleep device: Anytime you are  sleeping, including during daytime naps. While taking prescription pain medicines, sleeping medicines, or medicines that make you drowsy. Avoid smoking. Keep all follow-up visits as told by your health care provider. This is important. Contact a health care provider if: You keep feeling nauseous or you keep vomiting. You feel light-headed. You are still sleepy or having trouble with balance after 24 hours. You develop a rash. You have a fever. You have redness or swelling around the IV site. Get help right away if: You have trouble breathing. You have new-onset confusion at home. Summary For several hours after your procedure, you may feel tired. You may also be forgetful and have poor judgment. Have a responsible adult stay with you for the time you are told. It is important to have someone help care for you until you are awake and alert. Rest as told. Do not drive or operate machinery. Do not drink alcohol or take sleeping pills. Get help right away if you have trouble breathing, or if you suddenly become confused. This information  is not intended to replace advice given to you by your health care provider. Make sure you discuss any questions you have with your health care provider. Document Revised: 11/02/2019 Document Reviewed: 01/19/2019 Elsevier Patient Education  2022 [employer].

## 2020-11-28 ENCOUNTER — Other Ambulatory Visit: Payer: Self-pay

## 2020-11-28 ENCOUNTER — Encounter (HOSPITAL_COMMUNITY)
Admission: RE | Admit: 2020-11-28 | Discharge: 2020-11-28 | Disposition: A | Discharge status: Home / Self Care | Payer: 59 | Source: Ambulatory Visit | Attending: Gastroenterology | Admitting: Gastroenterology

## 2020-11-28 ENCOUNTER — Encounter (HOSPITAL_COMMUNITY): Payer: Self-pay

## 2020-11-28 DIAGNOSIS — Z86010 Personal history of colonic polyps: Secondary | ICD-10-CM

## 2020-12-03 ENCOUNTER — Encounter (HOSPITAL_COMMUNITY): Payer: Self-pay | Admitting: Gastroenterology

## 2020-12-03 ENCOUNTER — Ambulatory Visit (HOSPITAL_COMMUNITY): Payer: 59 | Admitting: Anesthesiology

## 2020-12-03 ENCOUNTER — Encounter (HOSPITAL_COMMUNITY)
Admission: RE | Disposition: A | Discharge status: Home / Self Care | Payer: Self-pay | Source: Ambulatory Visit | Attending: Gastroenterology

## 2020-12-03 ENCOUNTER — Ambulatory Visit (HOSPITAL_COMMUNITY)
Admission: RE | Admit: 2020-12-03 | Discharge: 2020-12-03 | Disposition: A | Discharge status: Home / Self Care | Payer: 59 | Source: Ambulatory Visit | Attending: Gastroenterology | Admitting: Gastroenterology

## 2020-12-03 ENCOUNTER — Other Ambulatory Visit: Payer: Self-pay

## 2020-12-03 DIAGNOSIS — Z880 Allergy status to penicillin: Secondary | ICD-10-CM | POA: Diagnosis not present

## 2020-12-03 DIAGNOSIS — Z86010 Personal history of colonic polyps: Secondary | ICD-10-CM

## 2020-12-03 DIAGNOSIS — Z09 Encounter for follow-up examination after completed treatment for conditions other than malignant neoplasm: Secondary | ICD-10-CM | POA: Insufficient documentation

## 2020-12-03 DIAGNOSIS — Z7952 Long term (current) use of systemic steroids: Secondary | ICD-10-CM | POA: Insufficient documentation

## 2020-12-03 DIAGNOSIS — K621 Rectal polyp: Secondary | ICD-10-CM

## 2020-12-03 DIAGNOSIS — Z9889 Other specified postprocedural states: Secondary | ICD-10-CM

## 2020-12-03 DIAGNOSIS — D3A8 Other benign neuroendocrine tumors: Secondary | ICD-10-CM | POA: Insufficient documentation

## 2020-12-03 DIAGNOSIS — Z79899 Other long term (current) drug therapy: Secondary | ICD-10-CM | POA: Diagnosis not present

## 2020-12-03 HISTORY — PX: POLYPECTOMY: SHX5525

## 2020-12-03 HISTORY — PX: FLEXIBLE SIGMOIDOSCOPY: SHX5431

## 2020-12-03 HISTORY — PX: HEMOSTASIS CLIP PLACEMENT: SHX6857

## 2020-12-03 LAB — HM COLONOSCOPY

## 2020-12-03 SURGERY — SIGMOIDOSCOPY, FLEXIBLE
Anesthesia: General

## 2020-12-03 MED ORDER — LACTATED RINGERS IV SOLN
INTRAVENOUS | Status: DC
  Administered 2020-12-03: 1000 mL via INTRAVENOUS

## 2020-12-03 MED ORDER — PROPOFOL 10 MG/ML IV BOLUS
INTRAVENOUS | Status: DC | PRN
  Administered 2020-12-03: 150 mg via INTRAVENOUS
  Administered 2020-12-03: 100 mg via INTRAVENOUS
  Administered 2020-12-03 (×2): 50 mg via INTRAVENOUS

## 2020-12-03 MED ORDER — LIDOCAINE HCL (CARDIAC) PF 50 MG/5ML IV SOSY
PREFILLED_SYRINGE | INTRAVENOUS | Status: DC | PRN
  Administered 2020-12-03: 50 mg via INTRAVENOUS

## 2020-12-03 MED ORDER — STERILE WATER FOR IRRIGATION IR SOLN
Status: DC | PRN
  Administered 2020-12-03: 100 mL

## 2020-12-03 NOTE — Anesthesia Postprocedure Evaluation (Signed)
Anesthesia Post Note  Patient: Travis Farmer  Procedure(s) Performed: FLEXIBLE SIGMOIDOSCOPY POLYPECTOMY HEMOSTASIS CLIP PLACEMENT LAPAROSCOPIC REMOVAL OF GASTRIC BAND  Patient location during evaluation: Phase II Anesthesia Type: General Level of consciousness: awake Pain management: pain level controlled Vital Signs Assessment: post-procedure vital signs reviewed and stable Respiratory status: spontaneous breathing and respiratory function stable Cardiovascular status: blood pressure returned to baseline and stable Postop Assessment: no headache and no apparent nausea or vomiting Anesthetic complications: no Comments: Late entry   No notable events documented.   Last Vitals:  Vitals:   12/03/20 1107 12/03/20 1207  BP: (!) 181/94 126/87  Pulse: (!) 107 97  Resp: 15 16  Temp: 36.7 C 36.6 C  SpO2: 100% 100%    Last Pain:  Vitals:   12/03/20 1207  TempSrc: Axillary  PainSc: 0-No pain                 Louann Sjogren

## 2020-12-03 NOTE — Anesthesia Procedure Notes (Signed)
Date/Time: 12/03/2020 11:42 AM Performed by: Vista Deck, CRNA Pre-anesthesia Checklist: Patient identified, Emergency Drugs available, Suction available, Timeout performed and Patient being monitored Patient Re-evaluated:Patient Re-evaluated prior to induction Oxygen Delivery Method: Nasal Cannula

## 2020-12-03 NOTE — Anesthesia Preprocedure Evaluation (Signed)
Anesthesia Evaluation  Patient identified by MRN, date of birth, ID band Patient awake    Reviewed: Allergy & Precautions, H&P , NPO status , Patient's Chart, lab work & pertinent test results, reviewed documented beta blocker date and time   Airway Mallampati: II  TM Distance: >3 FB Neck ROM: full    Dental no notable dental hx.    Pulmonary neg pulmonary ROS,    Pulmonary exam normal breath sounds clear to auscultation       Cardiovascular Exercise Tolerance: Good negative cardio ROS   Rhythm:regular Rate:Normal     Neuro/Psych negative neurological ROS  negative psych ROS   GI/Hepatic negative GI ROS, Neg liver ROS,   Endo/Other  negative endocrine ROS  Renal/GU negative Renal ROS  negative genitourinary   Musculoskeletal   Abdominal   Peds  Hematology negative hematology ROS (+)   Anesthesia Other Findings   Reproductive/Obstetrics negative OB ROS                             Anesthesia Physical Anesthesia Plan  ASA: 1  Anesthesia Plan: General   Post-op Pain Management:    Induction:   PONV Risk Score and Plan: Propofol infusion  Airway Management Planned:   Additional Equipment:   Intra-op Plan:   Post-operative Plan:   Informed Consent: I have reviewed the patients History and Physical, chart, labs and discussed the procedure including the risks, benefits and alternatives for the proposed anesthesia with the patient or authorized representative who has indicated his/her understanding and acceptance.     Dental Advisory Given  Plan Discussed with: CRNA  Anesthesia Plan Comments:         Anesthesia Quick Evaluation  

## 2020-12-03 NOTE — Op Note (Signed)
[facility] Patient Name: Travis Farmer Procedure Date: 12/03/2020 11:36 AM MRN: [id number] Date of Birth: 1970-10-04 Attending MD: Maylon Peppers ,  CSN: [id number] Age: 50 Admit Type: Outpatient Procedure:                Flexible Sigmoidoscopy Indications:              For therapy of rectal neuroendocrine tumor with                            margins <1 mm. Providers:                Maylon Peppers, Hinton Rao, RN, Nelma Rothman,                            Technician Referring MD:              Medicines:                Monitored Anesthesia Care Complications:            No immediate complications. Estimated Blood Loss:     Estimated blood loss: none. Procedure:                Pre-Anesthesia Assessment:                           - Prior to the procedure, a History and Physical                            was performed, and patient medications, allergies                            and sensitivities were reviewed. The patient's                            tolerance of previous anesthesia was reviewed.                           - The risks and benefits of the procedure and the                            sedation options and risks were discussed with the                            patient. All questions were answered and informed                            consent was obtained.                           - ASA Grade Assessment: II - A patient with mild                            systemic disease.                           After obtaining informed consent, the scope was  passed under direct vision. The GIF-H190 ([id number])                            scope was introduced through the anus and advanced                            to the the sigmoid colon. The flexible                            sigmoidoscopy was accomplished without difficulty.                            The patient tolerated the procedure well. Scope In: 11:48:56 AM Scope Out: 12:00:27  PM Total Procedure Duration: 0 hours 11 minutes 31 seconds  Findings:      The perianal and digital rectal examinations were normal.      A 5 mm post polypectomy scar was found in the rectum. The scar tissue       was healthy in appearance. Imaging was performed using white light and       narrow band imaging to visualize the mucosa. One band was successfully       placed to demarcate the area of previous scar to create a pseudopolyp       for EMR purposes. There was no bleeding during the procedure. The       pseudopolyp was removed with a hot snare. Resection and retrieval were       complete. To prevent bleeding post-intervention, one hemostatic clip was       successfully placed. There was no bleeding at the end of the procedure. Impression:               - Post-polypectomy scar in the rectum. Band EMR                            performed for complete treatment of neuroendocrine                            tumor. Clip was placed. Moderate Sedation:      Per Anesthesia Care Recommendation:           - Discharge patient to home (ambulatory).                           - Resume previous diet.                           - Await pathology results. Procedure Code(s):        --- Professional ---                           [phone], 59, Sigmoidoscopy, flexible; with removal of                            tumor(s), polyp(s), or other lesion(s) by snare                            technique Diagnosis Code(s):        ---  Professional ---                           [phone], Other specified postprocedural states                           K62.1, Rectal polyp CPT copyright 2019 American Medical Association. All rights reserved. The codes documented in this report are preliminary and upon coder review may  be revised to meet current compliance requirements. Maylon Peppers, MD Maylon Peppers,  12/03/2020 12:19:46 PM This report has been signed electronically. Number of Addenda: 0

## 2020-12-03 NOTE — Transfer of Care (Signed)
Immediate Anesthesia Transfer of Care Note  Patient: Travis Farmer  Procedure(s) Performed: FLEXIBLE SIGMOIDOSCOPY POLYPECTOMY  Patient Location: Short Stay  Anesthesia Type:General  Level of Consciousness: awake and patient cooperative  Airway & Oxygen Therapy: Patient Spontanous Breathing  Post-op Assessment: Report given to RN and Post -op Vital signs reviewed and stable  Post vital signs: Reviewed and stable  Last Vitals:  Vitals Value Taken Time  BP 126/87 12/03/20 1207  Temp 36.6 C 12/03/20 1207  Pulse 97 12/03/20 1207  Resp 16 12/03/20 1207  SpO2 100 % 12/03/20 1207    Last Pain:  Vitals:   12/03/20 1207  TempSrc: Axillary  PainSc: 0-No pain      Patients Stated Pain Goal: 7 (29/79/89 2119)  Complications: No notable events documented.

## 2020-12-03 NOTE — Discharge Instructions (Signed)
You are being discharged to home.  Resume your previous diet.  We are waiting for your pathology results.  

## 2020-12-03 NOTE — H&P (Signed)
Travis Farmer is an 50 y.o. male.   Chief Complaint: Follow-up of neuroendocrine tumor HPI: 50 year old male with past medical history of well differentiated rectal neuroendocrine tumor, who comes to the hospital for follow-up of neuroendocrine tumor.  The patient underwent a colonoscopy on May 01, 2020 where he was found to have 2 polyps in his colon both of which were removed with snare.  The rectal polyp was removed with a hot snare as it had at the stiff base.  Notably, the pathology came back positive for a well-differentiated neuroendocrine tumor with margins that were less than 1 mm in distance.  Since then, patient has been asymptomatic and denies having any complaints, denies having any nausea, vomiting, fever, chills, melena or hematochezia.  Past Medical History:  Diagnosis Date   Concussion     Past Surgical History:  Procedure Laterality Date   ADENOIDECTOMY     as child   COLONOSCOPY WITH PROPOFOL N/A 05/01/2020   Procedure: COLONOSCOPY WITH PROPOFOL;  Surgeon: Harvel Quale, MD;  [facility]: [facility];  Service: Gastroenterology;  Laterality: N/A;  AM   POLYPECTOMY  05/01/2020   Procedure: POLYPECTOMY INTESTINAL;  Surgeon: Harvel Quale, MD;  Location: [facility];  Service: Gastroenterology;;  rectal colon polyp    WISDOM TOOTH EXTRACTION      Family History  Problem Relation Age of Onset   Colon cancer Other    Social History:  reports that he has never smoked. He has never used smokeless tobacco. He reports current alcohol use. He reports that he does not use drugs.  Allergies:  Allergies  Allergen Reactions   Penicillins Shortness Of Breath and Rash    Medications Prior to Admission  Medication Sig Dispense Refill   buPROPion (WELLBUTRIN XL) 150 MG 24 hr tablet Take 150 mg by mouth in the morning.     Multiple Vitamin (MULTIVITAMIN WITH MINERALS) TABS tablet Take 1 tablet by mouth daily with lunch.     famotidine (PEPCID) 40 MG  tablet Take 1 tablet (40 mg total) by mouth daily. (Patient not taking: Reported on 11/21/2020) 30 tablet 0   hydrOXYzine (ATARAX/VISTARIL) 25 MG tablet Take 0.5-1 tablets (12.5-25 mg total) by mouth every 8 (eight) hours as needed for itching. (Patient not taking: No sig reported) 30 tablet 0   ondansetron (ZOFRAN ODT) 4 MG disintegrating tablet Take 1 tablet (4 mg total) by mouth every 8 (eight) hours as needed for nausea or vomiting. (Patient not taking: Reported on 11/21/2020) 20 tablet 0   predniSONE (DELTASONE) 20 MG tablet Take 2 tablets daily with breakfast. (Patient not taking: Reported on 11/21/2020) 10 tablet 0    No results found for this or any previous visit (from the past 48 hour(s)). No results found.  Review of Systems  Constitutional: Negative.   HENT: Negative.    Eyes: Negative.   Respiratory: Negative.    Cardiovascular: Negative.   Gastrointestinal: Negative.   Endocrine: Negative.   Genitourinary: Negative.   Musculoskeletal: Negative.   Skin: Negative.   Allergic/Immunologic: Negative.   Neurological: Negative.   Hematological: Negative.   Psychiatric/Behavioral: Negative.     Blood pressure (!) 181/94, pulse (!) 107, temperature 98 F (36.7 C), temperature source Oral, resp. rate 15, SpO2 100 %. Physical Exam  GENERAL: The patient is AO x3, in no acute distress. HEENT: Head is normocephalic and atraumatic. EOMI are intact. Mouth is well hydrated and without lesions. NECK: Supple. No masses LUNGS: Clear to auscultation. No presence of  rhonchi/wheezing/rales. Adequate chest expansion HEART: RRR, normal s1 and s2. ABDOMEN: Soft, nontender, no guarding, no peritoneal signs, and nondistended. BS +. No masses. EXTREMITIES: Without any cyanosis, clubbing, rash, lesions or edema. NEUROLOGIC: AOx3, no focal motor deficit. SKIN: no jaundice, no rashes  Assessment/Plan  50 year old male with past medical history of well differentiated rectal neuroendocrine tumor, who  comes to the hospital for follow-up of neuroendocrine tumor.  We will proceed with flexible Sigmoidoscopy with band endoscopic mucosal resection.  Harvel Quale, MD 12/03/2020, 11:40 AM

## 2020-12-04 LAB — SURGICAL PATHOLOGY

## 2020-12-05 ENCOUNTER — Encounter (INDEPENDENT_AMBULATORY_CARE_PROVIDER_SITE_OTHER): Payer: Self-pay | Admitting: *Deleted

## 2020-12-10 ENCOUNTER — Encounter (HOSPITAL_COMMUNITY): Payer: Self-pay | Admitting: Gastroenterology

## 2021-12-23 ENCOUNTER — Telehealth: Payer: 59 | Admitting: Physician Assistant

## 2021-12-23 DIAGNOSIS — B029 Zoster without complications: Secondary | ICD-10-CM

## 2021-12-23 MED ORDER — VALACYCLOVIR HCL 1 G PO TABS
1000.0000 mg | ORAL_TABLET | Freq: Three times a day (TID) | ORAL | 0 refills | Status: AC
Start: 2021-12-23 — End: 2021-12-30

## 2021-12-23 NOTE — Patient Instructions (Signed)
Myrtis Ser, thank you for joining Mar Daring, PA-C for today's virtual visit.  While this provider is not your primary care provider (PCP), if your PCP is located in our provider database this encounter information will be shared with them immediately following your visit.   [facility] account gives you access to today's visit and all your visits, tests, and labs performed at [facility] " click here if you don't have a [facility] account or go to [email or web address]  Consent: (Patient) Tylar Amborn provided verbal consent for this virtual visit at the beginning of the encounter.  Current Medications:  Current Outpatient Medications:    valACYclovir (VALTREX) 1000 MG tablet, Take 1 tablet (1,000 mg total) by mouth 3 (three) times daily for 7 days., Disp: 21 tablet, Rfl: 0   buPROPion (WELLBUTRIN XL) 150 MG 24 hr tablet, Take 150 mg by mouth in the morning., Disp: , Rfl:    famotidine (PEPCID) 40 MG tablet, Take 1 tablet (40 mg total) by mouth daily. (Patient not taking: Reported on 11/21/2020), Disp: 30 tablet, Rfl: 0   hydrOXYzine (ATARAX/VISTARIL) 25 MG tablet, Take 0.5-1 tablets (12.5-25 mg total) by mouth every 8 (eight) hours as needed for itching. (Patient not taking: No sig reported), Disp: 30 tablet, Rfl: 0   Multiple Vitamin (MULTIVITAMIN WITH MINERALS) TABS tablet, Take 1 tablet by mouth daily with lunch., Disp: , Rfl:    ondansetron (ZOFRAN ODT) 4 MG disintegrating tablet, Take 1 tablet (4 mg total) by mouth every 8 (eight) hours as needed for nausea or vomiting. (Patient not taking: Reported on 11/21/2020), Disp: 20 tablet, Rfl: 0 No current facility-administered medications for this visit.  Facility-Administered Medications Ordered in Other Visits:    gadobenate dimeglumine (MULTIHANCE) injection 15 mL, 15 mL, Intravenous, Once PRN, Garvin Fila, MD   Medications ordered in this encounter:  Meds ordered this encounter   Medications   valACYclovir (VALTREX) 1000 MG tablet    Sig: Take 1 tablet (1,000 mg total) by mouth 3 (three) times daily for 7 days.    Dispense:  21 tablet    Refill:  0    Order Specific Question:   Supervising Provider    Answer:   Chase Picket [id number]     *If you need refills on other medications prior to your next appointment, please contact your pharmacy*  Follow-Up: Call back or seek an in-person evaluation if the symptoms worsen or if the condition fails to improve as anticipated.  [place] [phone]  Other Instructions  Shingles  Shingles, which is also known as herpes zoster, is an infection that causes a painful skin rash and fluid-filled blisters. It is caused by a virus. Shingles only develops in people who: Have had chickenpox. Have been vaccinated against chickenpox. Shingles is rare in this group. What are the causes? Shingles is caused by varicella-zoster virus. This is the same virus that causes chickenpox. After a person is exposed to the virus, it stays in the body in an inactive (dormant) state. Shingles develops if the virus is reactivated. This can happen many years after the first (initial) exposure to the virus. It is not known what causes this virus to be reactivated. What increases the risk? People who have had chickenpox or received the chickenpox vaccine are at risk for shingles. Shingles infection is more common in people who: Are older than 52 years of age. Have a weakened disease-fighting system (immune system), such as people with:  HIV (human immunodeficiency virus). AIDS (acquired immunodeficiency syndrome). Cancer. Are taking medicines that weaken the immune system, such as organ transplant medicines. Are experiencing a lot of stress. What are the signs or symptoms? Early symptoms of this condition include itching, tingling, and pain in an area on your skin. Pain may be described as burning, stabbing, or  throbbing. A few days or weeks after early symptoms start, a painful red rash appears. The rash is usually on one side of the body and has a band-like or belt-like pattern. The rash eventually turns into fluid-filled blisters that break open, change into scabs, and dry up in about 2-3 weeks. At any time during the infection, you may also develop: A fever. Chills. A headache. Nausea. How is this diagnosed? This condition is diagnosed with a skin exam. Skin or fluid samples (a culture) may be taken from the blisters before a diagnosis is made. How is this treated? The rash may last for several weeks. There is not a specific cure for this condition. Your health care provider may prescribe medicines to help you manage pain, recover more quickly, and avoid long-term problems. Medicines may include: Antiviral medicines. Anti-inflammatory medicines. Pain medicines. Anti-itching medicines (antihistamines). If the area involved is on your face, you may be referred to a specialist, such as an eye doctor (ophthalmologist) or an ear, nose, and throat (ENT) doctor (otorhinolaryngologist) to help you avoid eye problems, chronic pain, or disability. Follow these instructions at home: Medicines Take over-the-counter and prescription medicines only as told by your health care provider. Apply an anti-itch cream or numbing cream to the affected area as told by your health care provider. Relieving itching and discomfort  Apply cold, wet cloths (cold compresses) to the area of the rash or blisters as told by your health care provider. Cool baths can be soothing. Try adding baking soda or dry oatmeal to the water to reduce itching. Do not bathe in hot water. Use calamine lotion as recommended by your health care provider. This is an over-the-counter lotion that helps to relieve itchiness. Blister and rash care Keep your rash covered with a loose bandage (dressing). Wear loose-fitting clothing to help ease the  pain of material rubbing against the rash. Wash your hands with soap and water for at least 20 seconds before and after you change your dressing. If soap and water are not available, use hand sanitizer. Change your dressing as told by your health care provider. Keep your rash and blisters clean by washing the area with mild soap and cool water as told by your health care provider. Check your rash every day for signs of infection. Check for: More redness, swelling, or pain. Fluid or blood. Warmth. Pus or a bad smell. Do not scratch your rash or pick at your blisters. To help avoid scratching: Keep your fingernails clean and cut short. Wear gloves or mittens while you sleep, if scratching is a problem. General instructions Rest as told by your health care provider. Wash your hands often with soap and water for at least 20 seconds. If soap and water are not available, use hand sanitizer. Doing this lowers your chance of getting a bacterial skin infection. Before your blisters change into scabs, your shingles infection can cause chickenpox in people who have never had it or have never been vaccinated against it. To prevent this from happening, avoid contact with other people, especially: Babies. Pregnant women. Children who have eczema. Older people who have transplants. People who have chronic illnesses,  such as cancer or AIDS. Keep all follow-up visits. This is important. How is this prevented? Getting vaccinated is the best way to prevent shingles and protect against shingles complications. If you have not been vaccinated, talk with your health care provider about getting the vaccine. Where to find more information Centers for Disease Control and Prevention: http://www.wolf.info/ Contact a health care provider if: Your pain is not relieved with prescribed medicines. Your pain does not get better after the rash heals. You have any of these signs of infection: More redness, swelling, or pain around  the rash. Fluid or blood coming from the rash. Warmth coming from your rash. Pus or a bad smell coming from the rash. A fever. Get help right away if: The rash is on your face or nose. You have facial pain, pain around your eye area, or loss of feeling on one side of your face. You have difficulty seeing. You have ear pain or have ringing in your ear. You have a loss of taste. Your condition gets worse. Summary Shingles, also known as herpes zoster, is an infection that causes a painful skin rash and fluid-filled blisters. This condition is diagnosed with a skin exam. Skin or fluid samples (a culture) may be taken from the blisters. Keep your rash covered with a loose bandage (dressing). Wear loose-fitting clothing to help ease the pain of material rubbing against the rash. Before your blisters change into scabs, your shingles infection can cause chickenpox in people who have never had it or have never been vaccinated against it. This information is not intended to replace advice given to you by your health care provider. Make sure you discuss any questions you have with your health care provider. Document Revised: 02/12/2020 Document Reviewed: 02/12/2020 Elsevier Patient Education  [place].    If you have been instructed to have an in-person evaluation today at a local Urgent Care facility, please use the link below. It will take you to a list of all of our available [facility], including address, phone number and hours of operation. Please do not delay care.  [facility]  If you or a family member do not have a primary care provider, use the link below to schedule a visit and establish care. When you choose a [employer] primary care physician or advanced practice provider, you gain a long-term partner in health. Find a Primary Care Provider  Learn more about [facility] in-office and virtual care options: [facility]

## 2021-12-23 NOTE — Progress Notes (Signed)
Virtual Visit Consent   Travis Farmer, you are scheduled for a virtual visit with a [facility] provider today. Just as with appointments in the office, your consent must be obtained to participate. Your consent will be active for this visit and any virtual visit you may have with one of our providers in the next 365 days. If you have a MyChart account, a copy of this consent can be sent to you electronically.  As this is a virtual visit, video technology does not allow for your provider to perform a traditional examination. This may limit your provider's ability to fully assess your condition. If your provider identifies any concerns that need to be evaluated in person or the need to arrange testing (such as labs, EKG, etc.), we will make arrangements to do so. Although advances in technology are sophisticated, we cannot ensure that it will always work on either your end or our end. If the connection with a video visit is poor, the visit may have to be switched to a telephone visit. With either a video or telephone visit, we are not always able to ensure that we have a secure connection.  By engaging in this virtual visit, you consent to the provision of healthcare and authorize for your insurance to be billed (if applicable) for the services provided during this visit. Depending on your insurance coverage, you may receive a charge related to this service.  I need to obtain your verbal consent now. Are you willing to proceed with your visit today? Travis Farmer has provided verbal consent on 12/23/2021 for a virtual visit (video or telephone). Mar Daring, PA-C  Date: 12/23/2021 9:17 AM  Virtual Visit via Video Note   I, Mar Daring, connected with  Travis Farmer  ([id number], 1970-12-27) on 12/23/21 at  9:45 AM EDT by a video-enabled telemedicine application and verified that I am speaking with the correct person using two identifiers.  Location: Patient: Virtual Visit  Location Patient: Home Provider: Virtual Visit Location Provider: Home Office   I discussed the limitations of evaluation and management by telemedicine and the availability of in person appointments. The patient expressed understanding and agreed to proceed.    History of Present Illness: Travis Farmer is a 51 y.o. who identifies as a male who was assigned male at birth, and is being seen today for rash.  HPI: Rash This is a new problem. The current episode started in the past 7 days (Sunday afternoon). The problem has been gradually worsening since onset. The affected locations include the scalp and neck. The rash is characterized by blistering, burning and redness. He was exposed to nothing. Associated symptoms include fatigue. Pertinent negatives include no congestion, facial edema, fever or shortness of breath. (Headaches) Past treatments include cold compress. The treatment provided no relief.    Problems: There are no problems to display for this patient.   Allergies:  Allergies  Allergen Reactions   Penicillins Shortness Of Breath and Rash   Medications:  Current Outpatient Medications:    valACYclovir (VALTREX) 1000 MG tablet, Take 1 tablet (1,000 mg total) by mouth 3 (three) times daily for 7 days., Disp: 21 tablet, Rfl: 0   buPROPion (WELLBUTRIN XL) 150 MG 24 hr tablet, Take 150 mg by mouth in the morning., Disp: , Rfl:    famotidine (PEPCID) 40 MG tablet, Take 1 tablet (40 mg total) by mouth daily. (Patient not taking: Reported on 11/21/2020), Disp: 30 tablet, Rfl: 0   hydrOXYzine (ATARAX/VISTARIL) 25 MG  tablet, Take 0.5-1 tablets (12.5-25 mg total) by mouth every 8 (eight) hours as needed for itching. (Patient not taking: No sig reported), Disp: 30 tablet, Rfl: 0   Multiple Vitamin (MULTIVITAMIN WITH MINERALS) TABS tablet, Take 1 tablet by mouth daily with lunch., Disp: , Rfl:    ondansetron (ZOFRAN ODT) 4 MG disintegrating tablet, Take 1 tablet (4 mg total) by mouth every 8  (eight) hours as needed for nausea or vomiting. (Patient not taking: Reported on 11/21/2020), Disp: 20 tablet, Rfl: 0 No current facility-administered medications for this visit.  Facility-Administered Medications Ordered in Other Visits:    gadobenate dimeglumine (MULTIHANCE) injection 15 mL, 15 mL, Intravenous, Once PRN, Garvin Fila, MD  Observations/Objective: Patient is well-developed, well-nourished in no acute distress.  Resting comfortably at home.  Head is normocephalic, atraumatic.  No labored breathing.  Speech is clear and coherent with logical content.  Patient is alert and oriented at baseline.  Vesicular rash on erythematous base on left side neck radiating up on the left occipital scalp  Assessment and Plan: 1. Herpes zoster without complication - valACYclovir (VALTREX) 1000 MG tablet; Take 1 tablet (1,000 mg total) by mouth 3 (three) times daily for 7 days.  Dispense: 21 tablet; Refill: 0  - Shingles suspected - Valtrex prescribed - Tylenol and Ibuprofen as needed - Cool compresses - Limit exposure to pregnant women, infants, elderly - Seek in person evaluation if worsening  Follow Up Instructions: I discussed the assessment and treatment plan with the patient. The patient was provided an opportunity to ask questions and all were answered. The patient agreed with the plan and demonstrated an understanding of the instructions.  A copy of instructions were sent to the patient via MyChart unless otherwise noted below.    The patient was advised to call back or seek an in-person evaluation if the symptoms worsen or if the condition fails to improve as anticipated.  Time:  I spent 11 minutes with the patient via telehealth technology discussing the above problems/concerns.    Mar Daring, PA-C

## 2022-01-25 IMAGING — CT CT ABD-PELV W/ CM
2 of 5 series · 16 of 46 positions shown, 18 images · IV contrast (Omnipaque or Isovue)
Comparison: None.

CLINICAL DATA: Acute left-sided abdominal pain.

EXAM:
CT ABDOMEN AND PELVIS WITH CONTRAST
TECHNIQUE: Multidetector CT imaging of the abdomen and pelvis was performed
using the standard protocol following bolus administration of
intravenous contrast.
CONTRAST:  100mL OMNIPAQUE IOHEXOL 300 MG/ML  SOLN

[Series 2: axial st · axial · 0.77mm/px · z∈[-989,-564]mm · 13 of 96 slices shown, 15 images]
[im 6/96  soft-tissue]
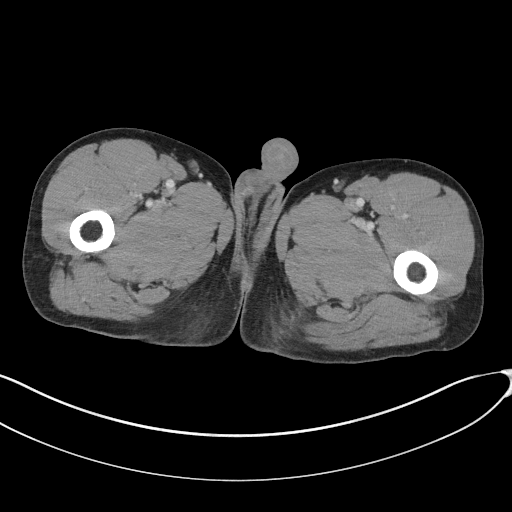
[im 6/96  bone]
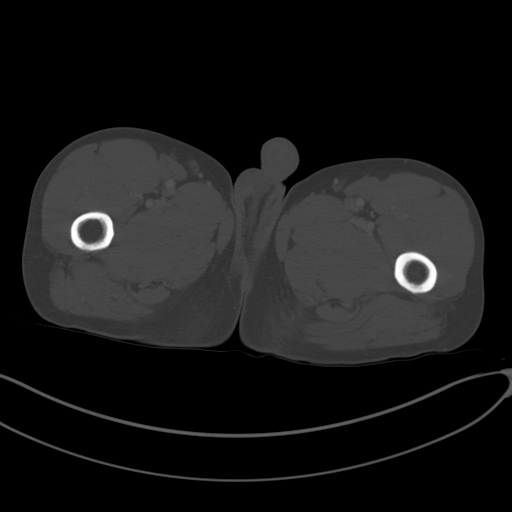
[im 16/96  soft-tissue]
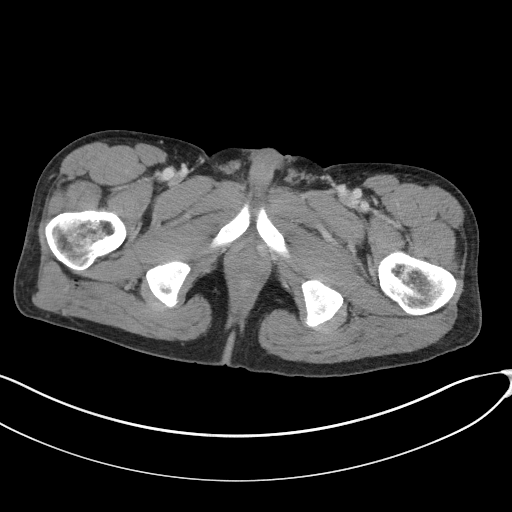
[im 21/96  soft-tissue]
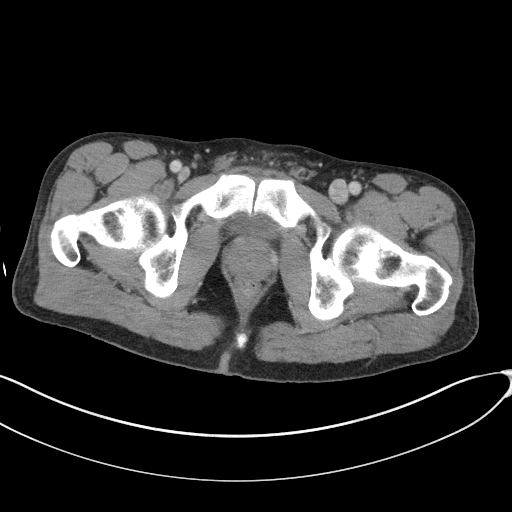
[im 26/96  soft-tissue]
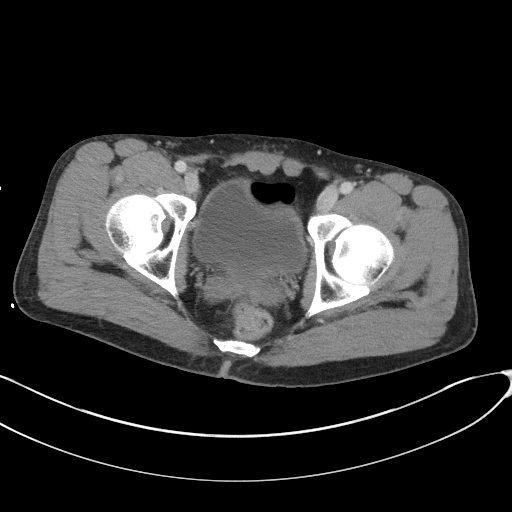
[im 36/96  soft-tissue]
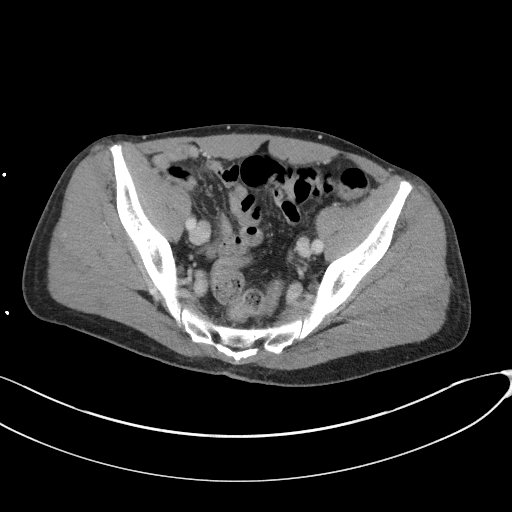
[im 41/96  soft-tissue]
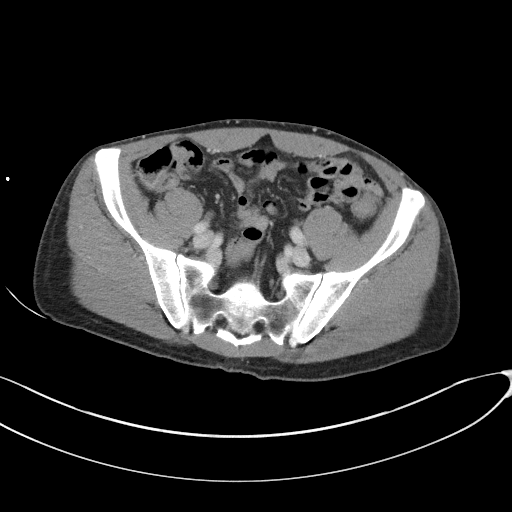
[im 51/96  soft-tissue]
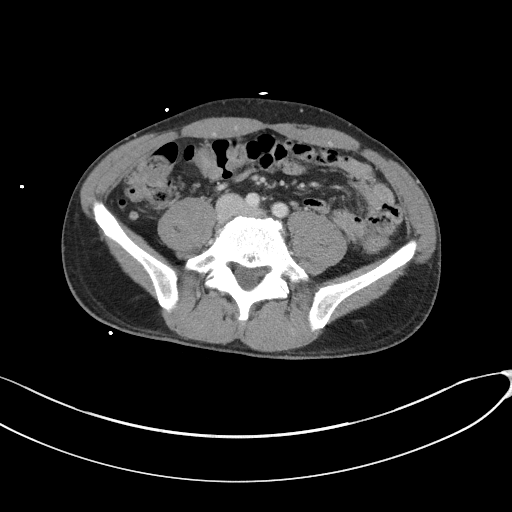
[im 56/96  soft-tissue]
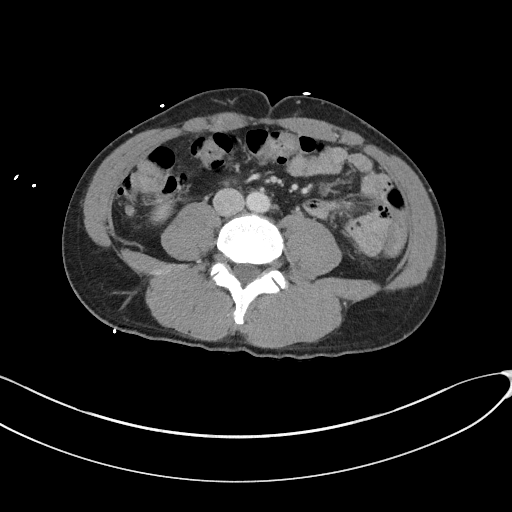
[im 61/96  soft-tissue]
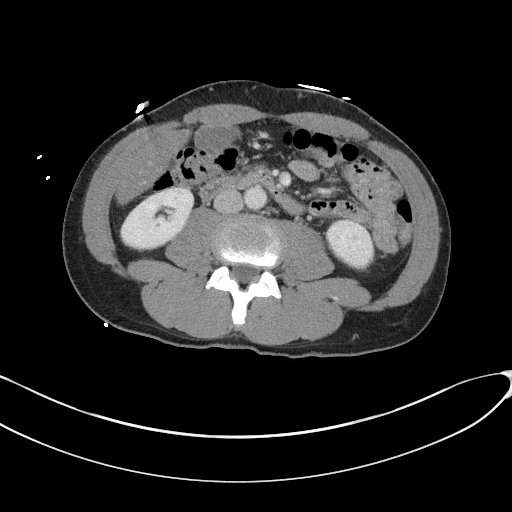
[im 61/96  bone]
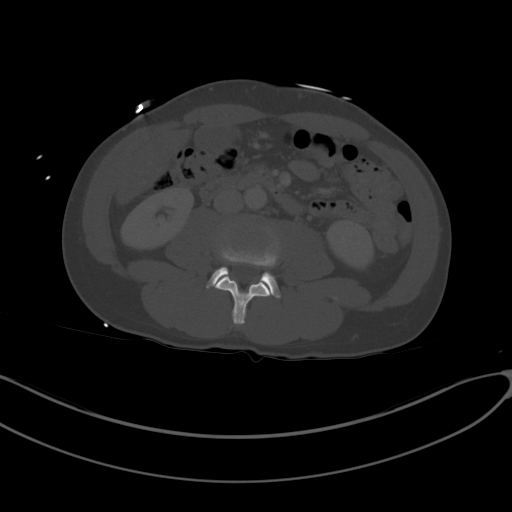
[im 71/96  soft-tissue]
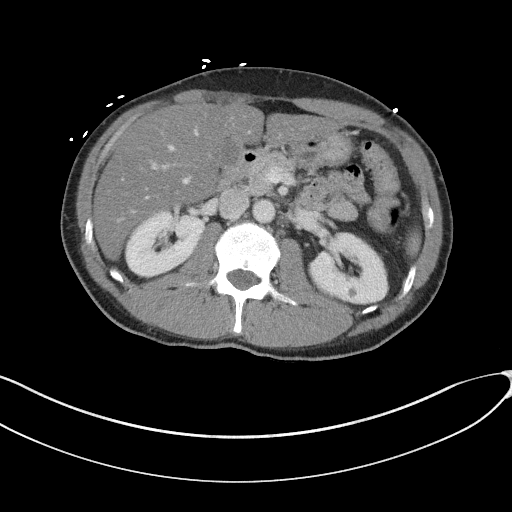
[im 76/96  soft-tissue]
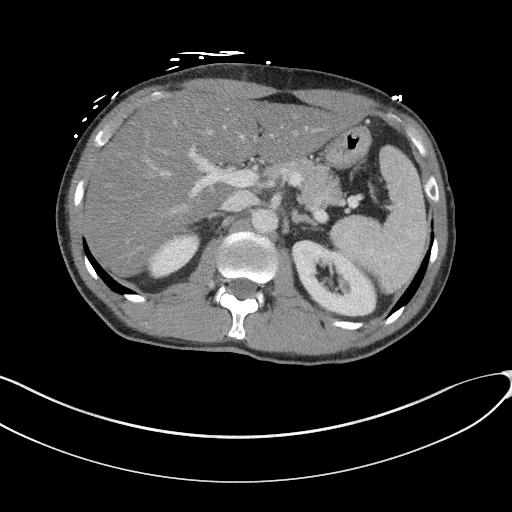
[im 81/96  soft-tissue]
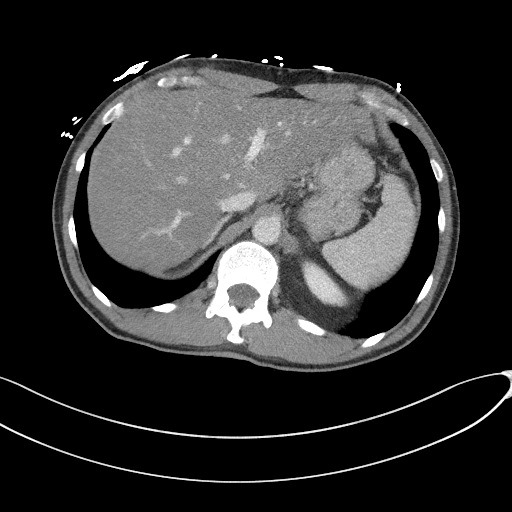
[im 91/96  soft-tissue]
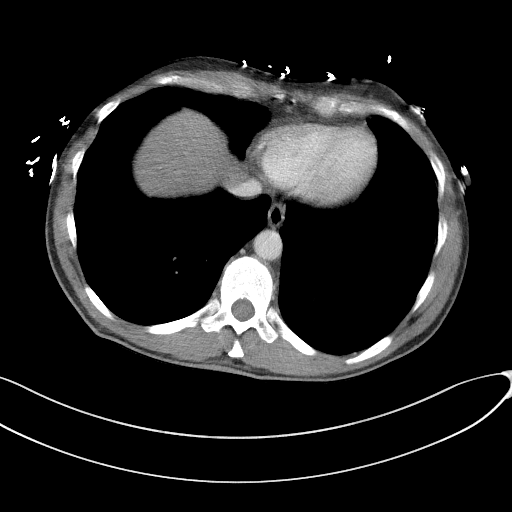

[Series 5: coronal st · coronal · 0.70mm/px · 3 of 100 slices shown]
[im 34/100  soft-tissue]
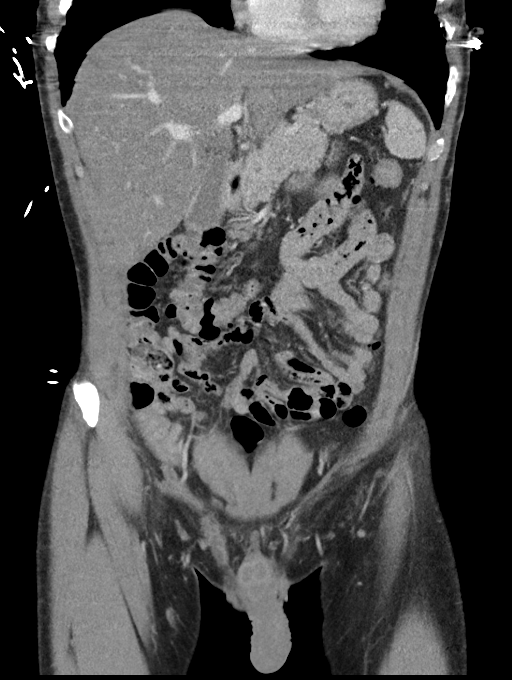
[im 45/100  soft-tissue]
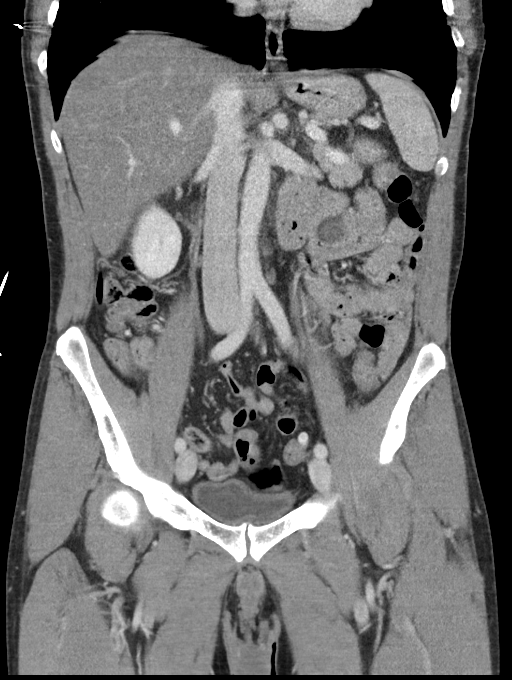
[im 56/100  soft-tissue]
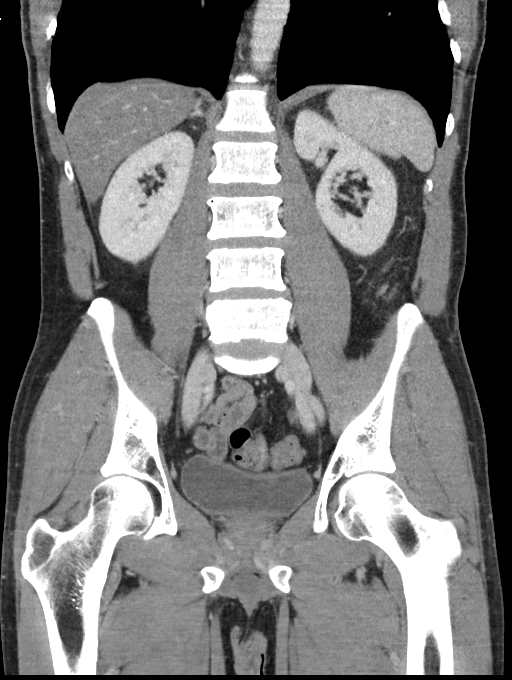

[16 of 46 positions shown; findings below may reference images not displayed]

FINDINGS: Lower chest: No acute abnormality.

Hepatobiliary: Hepatic steatosis. No gallstones or biliary
dilatation is noted.

Pancreas: Unremarkable. No pancreatic ductal dilatation or
surrounding inflammatory changes.

Spleen: Normal in size without focal abnormality.

Adrenals/Urinary Tract: Adrenal glands appear normal. Left renal
cysts are noted. No hydronephrosis or renal obstruction is noted.
Urinary bladder is unremarkable.

Stomach/Bowel: Stomach is within normal limits. Appendix appears
normal. No evidence of bowel wall thickening, distention, or
inflammatory changes.

Vascular/Lymphatic: No significant vascular findings are present. No
enlarged abdominal or pelvic lymph nodes.

Reproductive: Prostate is unremarkable.

Other: No abdominal wall hernia or abnormality. No abdominopelvic
ascites.

Musculoskeletal: No acute or significant osseous findings.
IMPRESSION: Hepatic steatosis.

No other abnormality seen in the abdomen or pelvis.

## 2022-01-30 IMAGING — US US ABDOMEN COMPLETE
1 series · 14 of 25 positions shown · non-contrast
Comparison: CT abdomen pelvis 08/29/2020

CLINICAL DATA: Unspecified abdominal pain

EXAM:
ABDOMEN ULTRASOUND COMPLETE

[Series 1: us abdomen complete · 14 of 119 slices shown]
[im 1/119]
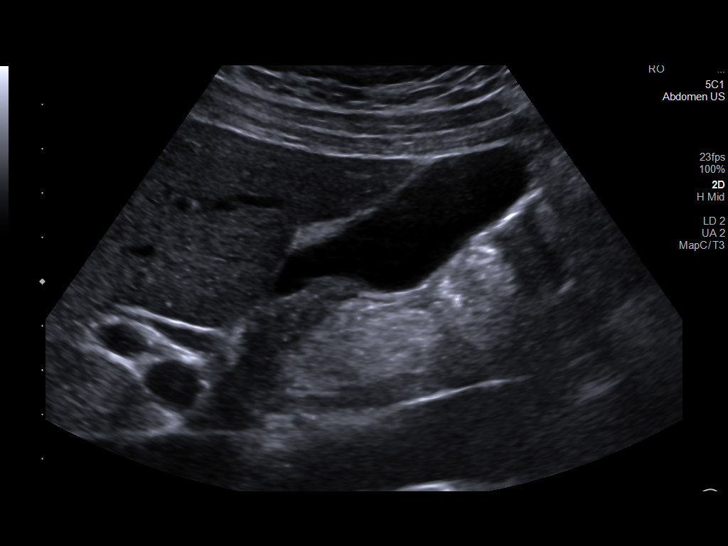
[im 10/119]
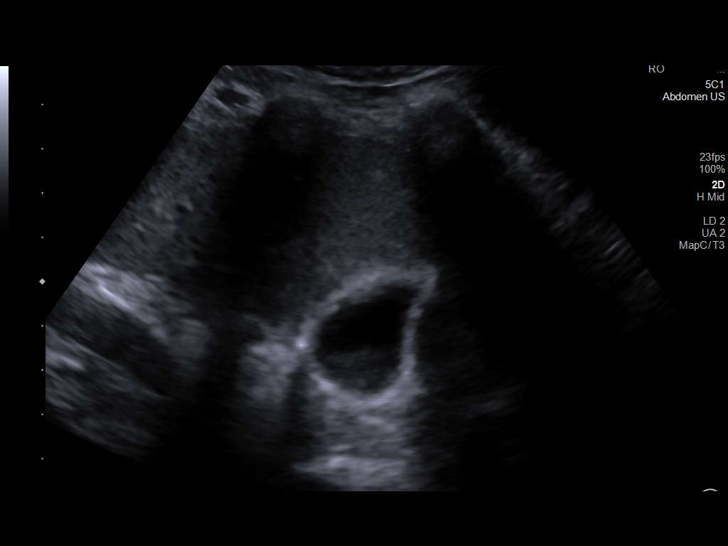
[im 20/119]
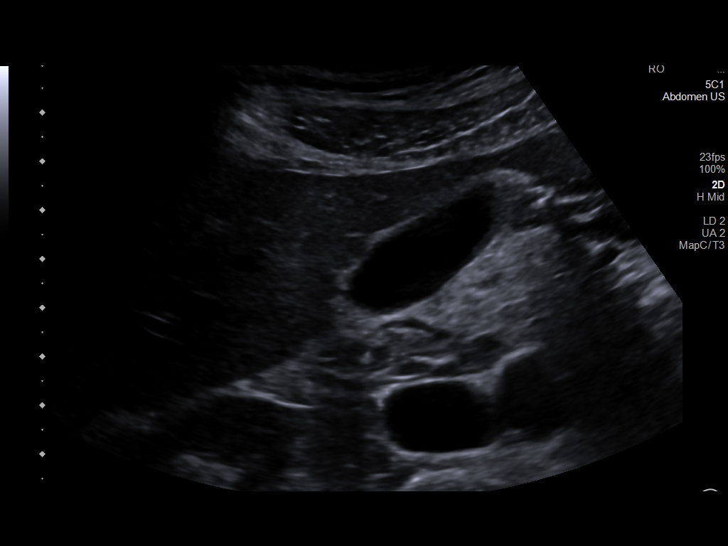
[im 30/119]
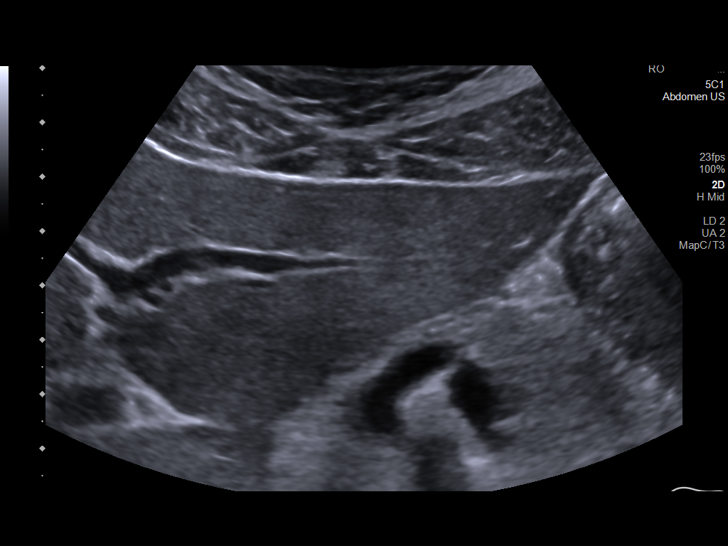
[im 40/119]
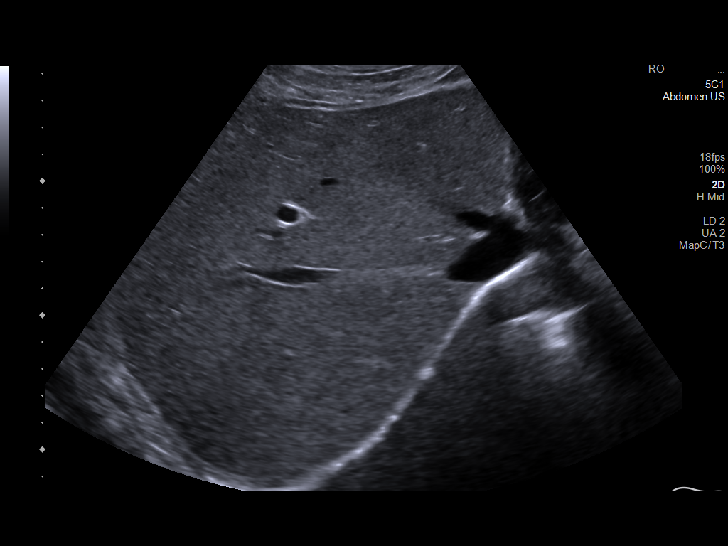
[im 45/119]
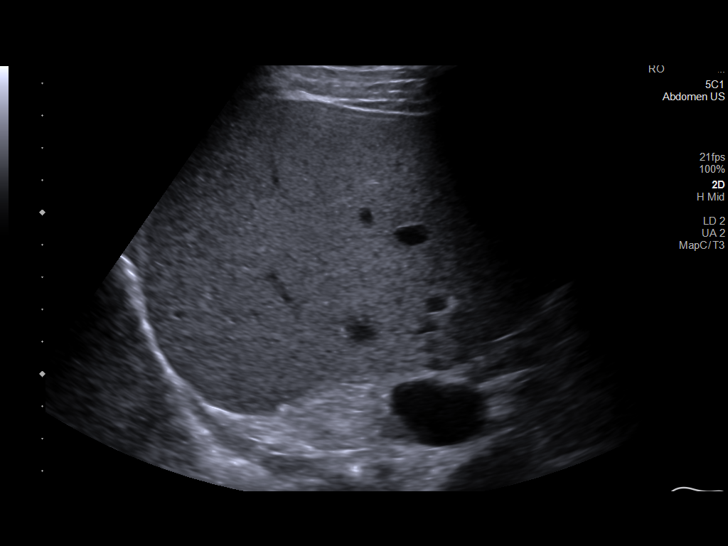
[im 55/119]
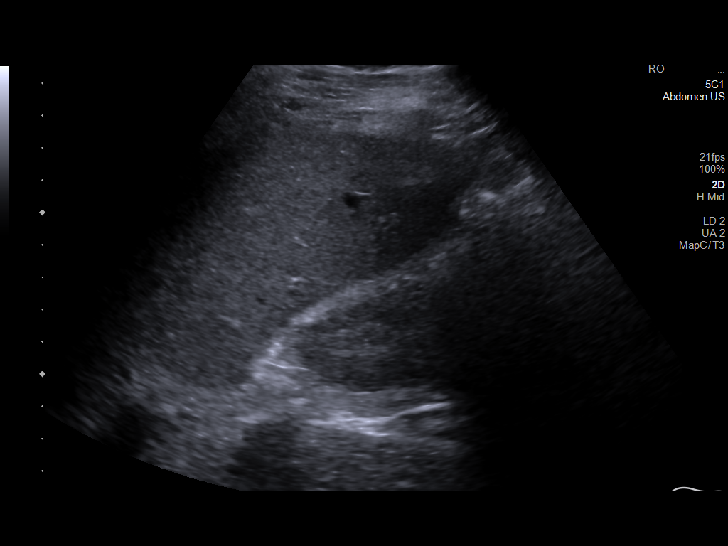
[im 64/119]
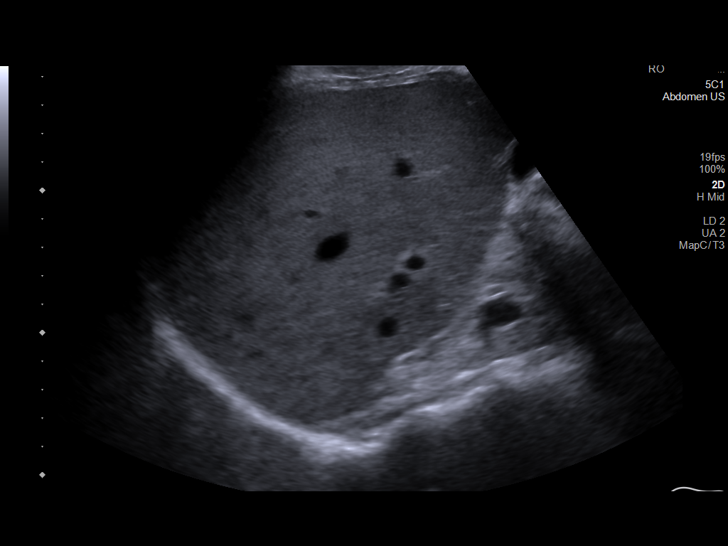
[im 74/119]
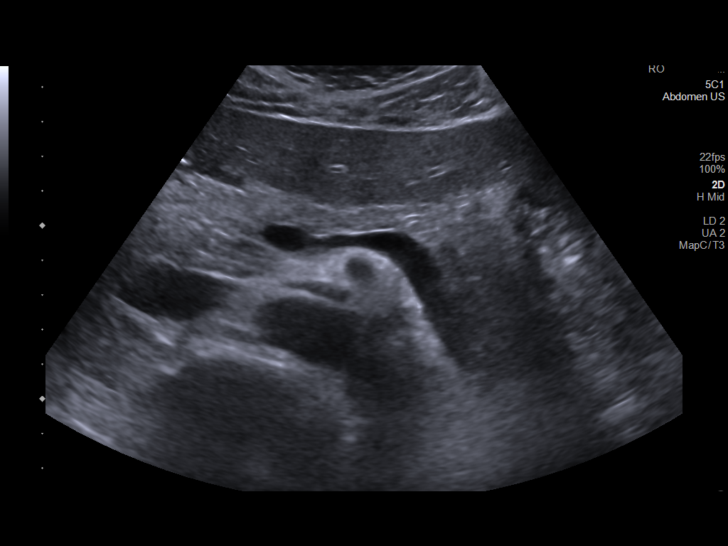
[im 79/119]
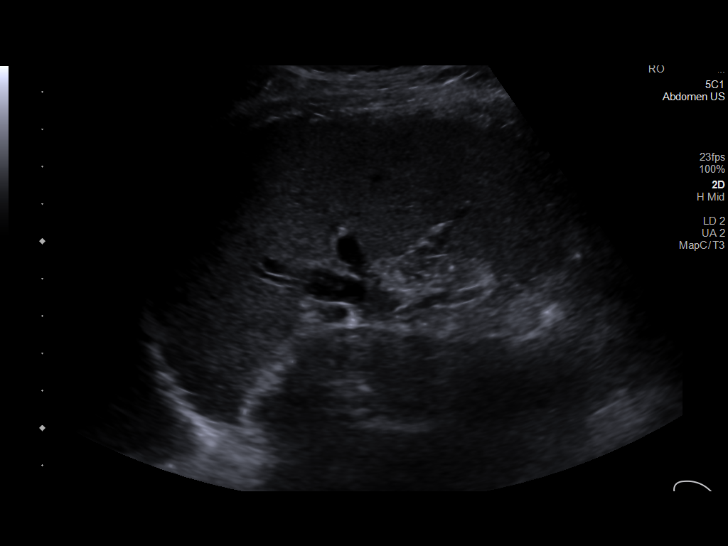
[im 89/119]
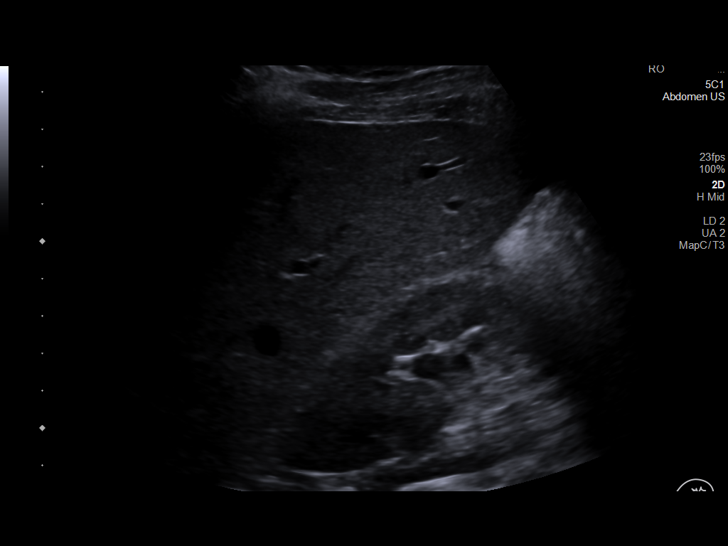
[im 99/119]
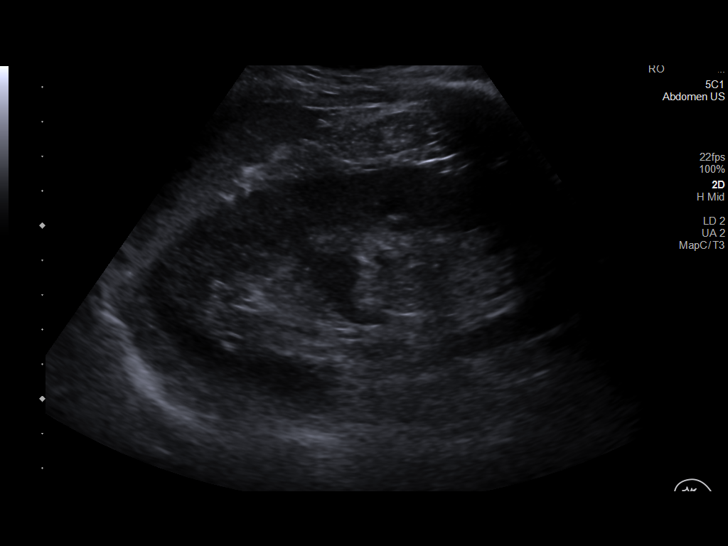
[im 109/119]
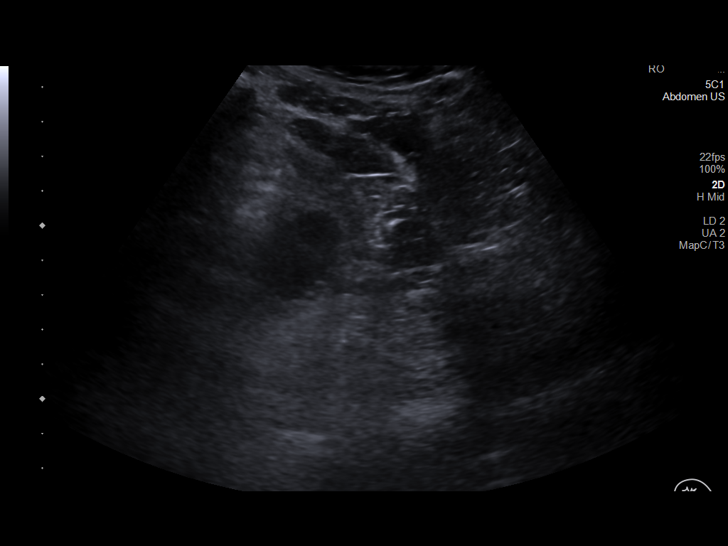
[im 119/119]
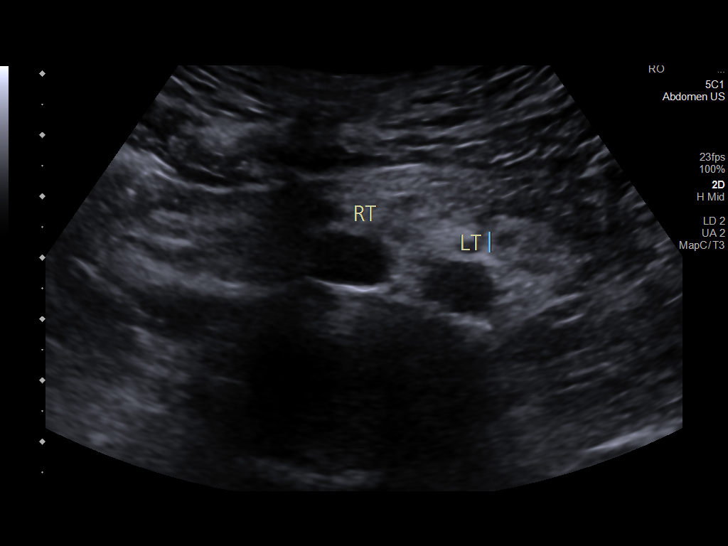

[14 of 25 positions shown; findings below may reference images not displayed]

FINDINGS: Gallbladder: No gallstones or wall thickening visualized. No
sonographic Murphy sign noted by sonographer.

Common bile duct: Diameter: 6 mm

Liver: No focal lesion identified. Within normal limits in
parenchymal echogenicity. Portal vein is patent on color Doppler
imaging with normal direction of blood flow towards the liver.

IVC: No abnormality visualized.

Pancreas: Visualized portion unremarkable.

Spleen: Size and appearance within normal limits.

Right Kidney: Length: 10.2 cm. Echogenicity within normal limits. No
mass or hydronephrosis visualized.

Left Kidney: Length: 12.4 cm. Echogenicity within normal limits. No
mass or hydronephrosis visualized.

Abdominal aorta: No aneurysm visualized.

Other findings: None.
IMPRESSION: No significant sonographic abnormality of the abdomen.

## 2022-04-15 ENCOUNTER — Other Ambulatory Visit (HOSPITAL_COMMUNITY): Payer: Self-pay | Admitting: Internal Medicine

## 2022-04-15 ENCOUNTER — Encounter: Payer: Self-pay | Admitting: Family Medicine

## 2022-04-15 DIAGNOSIS — Z8249 Family history of ischemic heart disease and other diseases of the circulatory system: Secondary | ICD-10-CM

## 2022-06-02 ENCOUNTER — Ambulatory Visit (HOSPITAL_COMMUNITY)
Admission: RE | Admit: 2022-06-02 | Discharge: 2022-06-02 | Disposition: A | Discharge status: Home / Self Care | Payer: 59 | Source: Ambulatory Visit | Attending: Internal Medicine | Admitting: Internal Medicine

## 2022-06-02 DIAGNOSIS — Z8249 Family history of ischemic heart disease and other diseases of the circulatory system: Secondary | ICD-10-CM

## 2022-11-03 ENCOUNTER — Telehealth: Payer: 59 | Admitting: Family Medicine

## 2022-11-03 DIAGNOSIS — B9689 Other specified bacterial agents as the cause of diseases classified elsewhere: Secondary | ICD-10-CM

## 2022-11-03 DIAGNOSIS — J019 Acute sinusitis, unspecified: Secondary | ICD-10-CM

## 2022-11-03 MED ORDER — AZITHROMYCIN 250 MG PO TABS: ORAL_TABLET | ORAL | 0 refills | Status: AC

## 2022-11-03 NOTE — Patient Instructions (Signed)
  Travis Farmer, thank you for joining [name], NP for today's virtual visit.  While this provider is not your primary care provider (PCP), if your PCP is located in our provider database this encounter information will be shared with them immediately following your visit.   A [facility] MyChart account gives you access to today's visit and all your visits, tests, and labs performed at [facility] " click here if you don't have a [facility] MyChart account or go to [email or web address]  Consent: (Patient) Travis Farmer provided verbal consent for this virtual visit at the beginning of the encounter.  Current Medications:  Current Outpatient Medications:    azithromycin (ZITHROMAX) 250 MG tablet, Take 2 tablets on day 1, then 1 tablet daily on days 2 through 5, Disp: 6 tablet, Rfl: 0   Medications ordered in this encounter:  Meds ordered this encounter  Medications   azithromycin (ZITHROMAX) 250 MG tablet    Sig: Take 2 tablets on day 1, then 1 tablet daily on days 2 through 5    Dispense:  6 tablet    Refill:  0    Order Specific Question:   Supervising Provider    Answer:   Merrilee Jansky [id number]     *If you need refills on other medications prior to your next appointment, please contact your pharmacy*  Follow-Up: Call back or seek an in-person evaluation if the symptoms worsen or if the condition fails to improve as anticipated.  [facility] [phone]  Other Instructions  - Increased rest - Increasing Fluids - Acetaminophen / ibuprofen as needed for fever/pain.  - Salt water gargling, chloraseptic spray and throat lozenges - Mucinex if mucus is present and increasing.  - Saline nasal spray if congestion or if nasal passages feel dry. - Humidifying the air.     If you have been instructed to have an in-person evaluation today at a local Urgent Care facility, please use the link below. It will take you to a list of all  of our available [facility], including address, phone number and hours of operation. Please do not delay care.  [facility]  If you or a family member do not have a primary care provider, use the link below to schedule a visit and establish care. When you choose a [facility] primary care physician or advanced practice provider, you gain a long-term partner in health. Find a Primary Care Provider  Learn more about [facility]'s in-office and virtual care options: [facility] - Get Care Now

## 2022-11-03 NOTE — Progress Notes (Signed)
Virtual Visit Consent   Travis Farmer, you are scheduled for a virtual visit with a [facility] provider today. Just as with appointments in the office, your consent must be obtained to participate. Your consent will be active for this visit and any virtual visit you may have with one of our providers in the next 365 days. If you have a MyChart account, a copy of this consent can be sent to you electronically.  As this is a virtual visit, video technology does not allow for your provider to perform a traditional examination. This may limit your provider's ability to fully assess your condition. If your provider identifies any concerns that need to be evaluated in person or the need to arrange testing (such as labs, EKG, etc.), we will make arrangements to do so. Although advances in technology are sophisticated, we cannot ensure that it will always work on either your end or our end. If the connection with a video visit is poor, the visit may have to be switched to a telephone visit. With either a video or telephone visit, we are not always able to ensure that we have a secure connection.  By engaging in this virtual visit, you consent to the provision of healthcare and authorize for your insurance to be billed (if applicable) for the services provided during this visit. Depending on your insurance coverage, you may receive a charge related to this service.  I need to obtain your verbal consent now. Are you willing to proceed with your visit today? [name] has provided verbal consent on 11/03/2022 for a virtual visit (video or telephone). [name], NP  Date: 11/03/2022 9:00 AM  Virtual Visit via Video Note   I, [name], connected with  Travis Farmer  ([id number], 1970-07-17) on 11/03/22 at  9:00 AM EDT by a video-enabled telemedicine application and verified that I am speaking with the correct person using two identifiers.  Location: Patient: Virtual Visit Location Patient:  Home Provider: Virtual Visit Location Provider: Home Office   I discussed the limitations of evaluation and management by telemedicine and the availability of in person appointments. The patient expressed understanding and agreed to proceed.    History of Present Illness: Travis Farmer is a 52 y.o. who identifies as a male who was assigned male at birth, and is being seen today for sinus infection  Onset was Saturday with pressure in head with great worsening in the last 24 hours Associated symptoms are increased congestion with yellow/dark tan mucus, pressure in face and head, sore throat- mild (drainage), low grade fever 100.5. cough is mild. Modifying factors are increased hydration,  Advil Denies chest pain, shortness of breath, chills  Exposure to sick contacts- unknown COVID test: negative  Vaccines:  not consistently    Problems: There are no problems to display for this patient.   Allergies:  Allergies  Allergen Reactions   Penicillins Shortness Of Breath and Rash   Medications:  Current Outpatient Medications:    buPROPion (WELLBUTRIN XL) 150 MG 24 hr tablet, Take 150 mg by mouth in the morning., Disp: , Rfl:    famotidine (PEPCID) 40 MG tablet, Take 1 tablet (40 mg total) by mouth daily. (Patient not taking: Reported on 11/21/2020), Disp: 30 tablet, Rfl: 0   hydrOXYzine (ATARAX/VISTARIL) 25 MG tablet, Take 0.5-1 tablets (12.5-25 mg total) by mouth every 8 (eight) hours as needed for itching. (Patient not taking: No sig reported), Disp: 30 tablet, Rfl: 0   Multiple Vitamin (MULTIVITAMIN  WITH MINERALS) TABS tablet, Take 1 tablet by mouth daily with lunch., Disp: , Rfl:    ondansetron (ZOFRAN ODT) 4 MG disintegrating tablet, Take 1 tablet (4 mg total) by mouth every 8 (eight) hours as needed for nausea or vomiting. (Patient not taking: Reported on 11/21/2020), Disp: 20 tablet, Rfl: 0 No current facility-administered medications for this visit.  Facility-Administered  Medications Ordered in Other Visits:    gadobenate dimeglumine (MULTIHANCE) injection 15 mL, 15 mL, Intravenous, Once PRN, [name], MD  Observations/Objective: Patient is well-developed, well-nourished in no acute distress.  Resting comfortably  at home.  Head is normocephalic, atraumatic.  No labored breathing.  Speech is clear and coherent with logical content.  Patient is alert and oriented at baseline.    Assessment and Plan:  1. Acute bacterial sinusitis  - azithromycin (ZITHROMAX) 250 MG tablet; Take 2 tablets on day 1, then 1 tablet daily on days 2 through 5  Dispense: 6 tablet; Refill: 0  - Increased rest - Increasing Fluids - Acetaminophen / ibuprofen as needed for fever/pain.  - Salt water gargling, chloraseptic spray and throat lozenges - Mucinex if mucus is present and increasing.  - Saline nasal spray if congestion or if nasal passages feel dry. - Humidifying the air.    Reviewed side effects, risks and benefits of medication.    Patient acknowledged agreement and understanding of the plan.   Past Medical, Surgical, Social History, Allergies, and Medications have been Reviewed.    Follow Up Instructions: I discussed the assessment and treatment plan with the patient. The patient was provided an opportunity to ask questions and all were answered. The patient agreed with the plan and demonstrated an understanding of the instructions.  A copy of instructions were sent to the patient via MyChart unless otherwise noted below.     The patient was advised to call back or seek an in-person evaluation if the symptoms worsen or if the condition fails to improve as anticipated.  Time:  I spent 10 minutes with the patient via telehealth technology discussing the above problems/concerns.    [name], NP

## 2024-02-24 ENCOUNTER — Telehealth: Admitting: Physician Assistant

## 2024-02-24 DIAGNOSIS — R6889 Other general symptoms and signs: Secondary | ICD-10-CM

## 2024-02-24 DIAGNOSIS — Z20828 Contact with and (suspected) exposure to other viral communicable diseases: Secondary | ICD-10-CM | POA: Diagnosis not present

## 2024-02-24 MED ORDER — BENZONATATE 100 MG PO CAPS: 100.0000 mg | ORAL_CAPSULE | Freq: Three times a day (TID) | ORAL | 0 refills | Status: DC | PRN

## 2024-02-24 MED ORDER — OSELTAMIVIR PHOSPHATE 75 MG PO CAPS: 75.0000 mg | ORAL_CAPSULE | Freq: Two times a day (BID) | ORAL | 0 refills | Status: DC

## 2024-02-24 MED ORDER — NAPROXEN 500 MG PO TABS: 500.0000 mg | ORAL_TABLET | Freq: Two times a day (BID) | ORAL | 0 refills | Status: DC

## 2024-02-24 NOTE — Progress Notes (Signed)
 E visit for Flu like symptoms   We are sorry that you are not feeling well.  Here is how we plan to help! Based on what you have shared with me it looks like you may have a respiratory virus that may be influenza.  Influenza or the flu is  an infection caused by a respiratory virus. The flu virus is highly contagious and persons who did not receive their yearly flu vaccination may catch the flu from close contact.  We have anti-viral medications to treat the viruses that cause this infection. They are not a cure and only shorten the course of the infection. These prescriptions are most effective when they are given within the first 2 days of flu symptoms. Antiviral medications are indicated if you have a high risk of complications from the flu. You should  also consider an antiviral medication if you are in close contact with someone who is at risk. These medications can help patients avoid complications from the flu but have side effects that you should know.   Possible side effects from Tamiflu  or oseltamivir  include nausea, vomiting, diarrhea, dizziness, headaches, eye redness, sleep problems or other respiratory symptoms. You should not take Tamiflu  if you have an allergy to oseltamivir  or any to the ingredients in Tamiflu .  Based upon your symptoms and potential risk factors I have prescribed Oseltamivir  (Tamiflu ).  It has been sent to your designated pharmacy.  You will take one 75 mg capsule orally twice a day for the next 5 days.   For nasal congestion, you may use an oral decongestant such as Mucinex D or if you have glaucoma or high blood pressure use plain Mucinex.  Saline nasal spray or nasal drops can help and can safely be used as often as needed for congestion.  If you have a sore or scratchy throat, use a saltwater gargle-  to  teaspoon of salt dissolved in a 4-ounce to 8-ounce glass of warm water.  Gargle the solution for approximately 15-30 seconds and then spit.  It is  important not to swallow the solution.  You can also use throat lozenges/cough drops and Chloraseptic spray to help with throat pain or discomfort.  Warm or cold liquids can also be helpful in relieving throat pain.  For headache, pain or general discomfort, you can use Ibuprofen  or Tylenol  as directed.   Some authorities believe that zinc sprays or the use of Echinacea may shorten the course of your symptoms.  I have prescribed the following medications to help lessen symptoms: I have prescribed Tessalon  Perles 100 mg. You may take 1-2 capsules every 8 hours as needed for cough and I have prescribed an anti-inflammatory - Naprosyn 500 mg. Take twice daily as needed for fever or body aches for 2 weeks  You are to isolate at home until you have been fever-free for at least 24 hours without a fever-reducing medication, and symptoms have been steadily improving for 24 hours.  If you must be around other household members who do not have symptoms, you need to make sure that both you and the family members are masking consistently with a high-quality mask.  If you note any worsening of symptoms despite treatment, please seek an in-person evaluation ASAP. If you note any significant shortness of breath or any chest pain, please seek ED evaluation. Please do not delay care!  ANYONE WHO HAS FLU SYMPTOMS SHOULD: Stay home. The flu is highly contagious and going out or to work exposes others! Be  sure to drink plenty of fluids. Water is fine as well as fruit juices, sodas and electrolyte beverages. You may want to stay away from caffeine or alcohol. If you are nauseated, try taking small sips of liquids. How do you know if you are getting enough fluid? Your urine should be a pale yellow or almost colorless. Get rest. Taking a steamy shower or using a humidifier may help nasal congestion and ease sore throat pain. Using a saline nasal spray works much the same way. Cough drops, hard candies and sore throat  lozenges may ease your cough. Line up a caregiver. Have someone check on you regularly.  GET HELP RIGHT AWAY IF: You cannot keep down liquids or your medications. You become short of breath Your fell like you are going to pass out or loose consciousness. Your symptoms persist after you have completed your treatment plan  MAKE SURE YOU  Understand these instructions. Will watch your condition. Will get help right away if you are not doing well or get worse.  Your e-visit answers were reviewed by a board certified advanced clinical practitioner to complete your personal care plan.  Depending on the condition, your plan could have included both over the counter or prescription medications.  If there is a problem please reply  once you have received a response from your provider.  Your safety is important to us .  If you have drug allergies check your prescription carefully.    You can use MyChart to ask questions about todays visit, request a non-urgent call back, or ask for a work or school excuse for 24 hours related to this e-Visit. If it has been greater than 24 hours you will need to follow up with your provider, or enter a new e-Visit to address those concerns.  You will get an e-mail in the next two days asking about your experience.  I hope that your e-visit has been valuable and will speed your recovery. Thank you for using e-visits.   I have spent 5 minutes in review of e-visit questionnaire, review and updating patient chart, medical decision making and response to patient.   Delon CHRISTELLA Dickinson, PA-C

## 2024-03-27 ENCOUNTER — Telehealth: Admitting: Nurse Practitioner

## 2024-03-27 DIAGNOSIS — U071 COVID-19: Secondary | ICD-10-CM

## 2024-03-27 MED ORDER — BENZONATATE 100 MG PO CAPS: 100.0000 mg | ORAL_CAPSULE | Freq: Three times a day (TID) | ORAL | 0 refills | Status: AC | PRN

## 2024-03-27 NOTE — Progress Notes (Signed)
 Your test for COVID-19 was positive, meaning that you were infected with the novel coronavirus and could give the germ to others.    Most people with these infections have a mild illness and can recover at home without medical care. Do not leave your home, except to get medical care.  DO not visit public areas and do not go to places where you are unable to wear a mask. It is important for you to stay home to take care of yourself and to help protect other people in your home and community.   Isolation Instructions:  You are to isolate at home for now until you have taken your home COVID/Flu test and notified our team of your results, at which time further isolation instructions will be given.  If you must be around other household members who do not have symptoms, you need to make sure that both you and the family members are masking consistently with a high-quality mask, even while in the home.  If you note any worsening of symptoms despite treatment, please seek an IN-PERSON evaluation ASAP. If you note any significant shortness of breath or any chest pain, please seek immediate ER evaluation. Please do not delay care!  Go to the nearest hospital ED for assessment if fever/cough/breathlessness are severe or illness seems like a threat to life.    The following symptoms may appear 2-14 days after exposure: Fever Cough Shortness of breath or difficulty breathing Chills Repeated shaking with chills Muscle pain Headache Sore throat New loss of taste or smell Fatigue Congestion or runny nose Nausea or vomiting Diarrhea   For symptoms,  I have prescribed Tessalon  Perles 100 mg. You may take 1-2 capsules every 8 hours as needed for cough You may also take acetaminophen (Tylenol) as needed for fever.   Reduce your risk of any infection by using the same precautions used for avoiding the common cold or flu:  Wash your hands often with soap and warm water  for at least 20 seconds.  If soap  and water  are not readily available, use an alcohol-based hand sanitizer with at least 60% alcohol.  If coughing or sneezing, cover your mouth and nose by coughing or sneezing into the elbow areas of your shirt or coat, into a tissue or into your sleeve (not your hands). Avoid shaking hands with others and consider head nods or verbal greetings only. Avoid touching your eyes, nose, or mouth with unwashed hands.  Avoid close contact with people who are sick. Avoid places or events with large numbers of people in one location, like concerts or sporting events. Carefully consider travel plans you have or are making. If you are planning any travel outside or inside the [place] , visit the CDC's Travelers' Health webpage for the latest health notices. If you have some symptoms but not all symptoms, continue to monitor at home and seek medical attention if your symptoms worsen. If you are having a medical emergency, call 911.  HOME CARE Only take medications as instructed by your medical team. Drink plenty of fluids and get plenty of rest. A steam or ultrasonic humidifier can help if you have congestion.   GET HELP RIGHT AWAY IF YOU HAVE EMERGENCY WARNING SIGNS** FOR COVID-19. If you or someone is showing any of these signs seek emergency medical care immediately. Call 911 or proceed to your closest emergency facility if: You develop worsening high fever. Trouble breathing. Bluish lips or face. Persistent pain or pressure in the chest. New confusion.  Inability to wake or stay awake. You cough up blood. Your symptoms become more severe.  **This list is not all possible symptoms. Contact your medical provider for any symptoms that are sever or concerning to you.   MAKE SURE YOU  Understand these instructions. Will watch your condition. Will get help right away if you are not doing well or get worse.  Your e-visit answers were reviewed by a board certified advanced clinical practitioner to  complete your personal care plan.  Depending on the condition, your plan could have included both over the counter or prescription medications.  If there is a problem, please reply once you have received a response from your provider.  Your safety is important to us .  If you have drug allergies check your prescription carefully.    You can use MyChart to ask questions about today's visit, request a non-urgent call back, or ask for a work or school excuse for 24 hours related to this e-Visit. If it has been greater than 24 hours you will need to follow up with your provider, or enter a new e-Visit to address those concerns. You will get an e-mail in the next two days asking about your experience.  I hope that your e-visit has been valuable and will speed your recovery. Thank you for using e-visits.   I have spent 5 minutes in review of e-visit questionnaire, review and updating patient chart, medical decision making and response to patient.   Elsie Velma Lunger, PA-C
